# Patient Record
Sex: Male | Born: 1965
Health system: Southern US, Community
[De-identification: ages and names within clinical notes are randomized; demographics above are authoritative.]

## PROBLEM LIST (undated history)

## (undated) DIAGNOSIS — M545 Low back pain, unspecified: Secondary | ICD-10-CM

## (undated) DIAGNOSIS — Z8619 Personal history of other infectious and parasitic diseases: Secondary | ICD-10-CM

## (undated) DIAGNOSIS — G35 Multiple sclerosis: Secondary | ICD-10-CM

## (undated) HISTORY — DX: Multiple sclerosis: G35

## (undated) HISTORY — DX: Low back pain: M54.5

## (undated) HISTORY — DX: Personal history of other infectious and parasitic diseases: Z86.19

## (undated) HISTORY — DX: Low back pain, unspecified: M54.50

---
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
## 1992-06-22 HISTORY — PX: APPENDECTOMY: SHX54

## 2006-07-03 ENCOUNTER — Encounter: Admission: RE | Admit: 2006-07-03 | Discharge: 2006-07-03 | Payer: Self-pay | Admitting: Ophthalmology

## 2006-07-14 ENCOUNTER — Ambulatory Visit (HOSPITAL_COMMUNITY): Admission: RE | Admit: 2006-07-14 | Discharge: 2006-07-14 | Payer: Self-pay | Admitting: Neurology

## 2007-08-19 ENCOUNTER — Ambulatory Visit (HOSPITAL_COMMUNITY): Admission: RE | Admit: 2007-08-19 | Discharge: 2007-08-19 | Payer: Self-pay | Admitting: Neurology

## 2007-11-20 ENCOUNTER — Ambulatory Visit (HOSPITAL_COMMUNITY): Admission: RE | Admit: 2007-11-20 | Discharge: 2007-11-20 | Payer: Self-pay | Admitting: Neurology

## 2008-09-07 ENCOUNTER — Ambulatory Visit (HOSPITAL_COMMUNITY): Admission: RE | Admit: 2008-09-07 | Discharge: 2008-09-07 | Payer: Self-pay | Admitting: Neurology

## 2010-07-13 ENCOUNTER — Encounter: Payer: Self-pay | Admitting: Ophthalmology

## 2010-11-07 NOTE — Op Note (Signed)
NAME:  FAREED, FUNG NO.:  [id number]   MEDICAL RECORD NO.:  [id number]          PATIENT TYPE:  OUT   LOCATION:  [facility]                          FACILITY:  [facility]   PHYSICIAN:  Genene Churn. Love, M.D.    DATE OF BIRTH:  02-08-1966   DATE OF PROCEDURE:  07/14/2006  DATE OF DISCHARGE:                               OPERATIVE REPORT   CLINICAL INDICATIONS:  The patient has a history of abnormal MRI study  of the brain and the cervical spine is being evaluated for the  possibility of a demyelinating disorder.   PROCEDURE:  The patient was prepped and draped in the left lateral  position using Betadine and 1% Xylocaine.  The L4-L5 interspace was  entered without difficulty.  Opening pressure was 150 mmH20.  Clear  colorless CSF was obtained and sent for protein, glucose, cell count,  diff, angiotensin converting enzyme, IgG, oligoclonal IgG. The patient  tolerated the procedure well.           ______________________________  Genene Churn. Sandria Manly, M.D.     JML/MEDQ  D:  07/14/2006  T:  07/14/2006  Job:  [id number]

## 2011-07-27 ENCOUNTER — Ambulatory Visit (INDEPENDENT_AMBULATORY_CARE_PROVIDER_SITE_OTHER): Payer: Self-pay | Admitting: Pharmacist

## 2011-07-27 ENCOUNTER — Encounter: Payer: Self-pay | Admitting: Pharmacist

## 2011-07-27 DIAGNOSIS — G35 Multiple sclerosis: Secondary | ICD-10-CM

## 2011-07-27 DIAGNOSIS — M549 Dorsalgia, unspecified: Secondary | ICD-10-CM | POA: Insufficient documentation

## 2011-07-27 NOTE — Progress Notes (Signed)
  Subjective:    Patient ID: Dennis Leonard, male    DOB: Jun 28, 1965, 46 y.o.   MRN: [id number]  HPI Patient arrives for medication review with significant angst about having to pay $80 for previously $10 Co-Pay Avonex.  He expressed his unhappiness about the need to pay higher medication prices as he has been stable.   We discussed pricing and need to establish medical history under [facility] to have eligibility to lower tier pricing.   Reports seeing Dr. Avie Echevaria as neurologist.  Reports being diagnosed with MS since 2008 and states she is under acceptable level of control.      Review of Systems     Objective:   Physical Exam        Assessment & Plan:  Following medication review, no suggestions for change.  Complete medication list provided to patient.  Total time in face to face medication review: 25 minutes.

## 2011-07-27 NOTE — Assessment & Plan Note (Signed)
MS stable on Avonex.  Following medication review, no suggestions for change.  Complete medication list provided to patient.  Total time in face to face medication review: 25 minutes.

## 2011-07-27 NOTE — Progress Notes (Signed)
  Subjective:    Patient ID: Dennis Leonard, male    DOB: Mar 08, 1966, 46 y.o.   MRN: [id number]  HPI Reviewed and agree with Dr. Macky Lower management.    Review of Systems     Objective:   Physical Exam        Assessment & Plan:

## 2011-08-25 ENCOUNTER — Encounter: Payer: Self-pay | Admitting: Internal Medicine

## 2011-08-25 ENCOUNTER — Ambulatory Visit (INDEPENDENT_AMBULATORY_CARE_PROVIDER_SITE_OTHER): Payer: 59 | Admitting: Internal Medicine

## 2011-08-25 ENCOUNTER — Other Ambulatory Visit (INDEPENDENT_AMBULATORY_CARE_PROVIDER_SITE_OTHER): Payer: 59

## 2011-08-25 VITALS — BP 110/70 | HR 72 | Temp 98.0°F | Ht 72.0 in | Wt 213.4 lb

## 2011-08-25 DIAGNOSIS — Z0000 Encounter for general adult medical examination without abnormal findings: Secondary | ICD-10-CM

## 2011-08-25 LAB — LDL CHOLESTEROL, DIRECT: Direct LDL: 76 mg/dL

## 2011-08-25 LAB — BASIC METABOLIC PANEL
CO2: 29 mEq/L (ref 19–32)
Calcium: 9.5 mg/dL (ref 8.4–10.5)
Chloride: 102 mEq/L (ref 96–112)
Creatinine, Ser: 0.9 mg/dL (ref 0.4–1.5)
Glucose, Bld: 90 mg/dL (ref 70–99)
Potassium: 4.5 mEq/L (ref 3.5–5.1)

## 2011-08-25 LAB — CBC WITH DIFFERENTIAL/PLATELET
Basophils Absolute: 0 10*3/uL (ref 0.0–0.1)
Hemoglobin: 16.3 g/dL (ref 13.0–17.0)
Lymphocytes Relative: 28.1 % (ref 12.0–46.0)
MCV: 94.5 fl (ref 78.0–100.0)
Monocytes Relative: 12.7 % — ABNORMAL HIGH (ref 3.0–12.0)
RBC: 5.04 Mil/uL (ref 4.22–5.81)
RDW: 12.9 % (ref 11.5–14.6)

## 2011-08-25 LAB — HEPATIC FUNCTION PANEL
AST: 27 U/L (ref 0–37)
Albumin: 4.2 g/dL (ref 3.5–5.2)
Total Bilirubin: 0.4 mg/dL (ref 0.3–1.2)

## 2011-08-25 LAB — URINALYSIS
Hgb urine dipstick: NEGATIVE
Ketones, ur: NEGATIVE
Leukocytes, UA: NEGATIVE
Nitrite: NEGATIVE
Urobilinogen, UA: 0.2 (ref 0.0–1.0)

## 2011-08-25 LAB — LIPID PANEL
HDL: 47.4 mg/dL (ref >39.00)
Triglycerides: 316 mg/dL — ABNORMAL HIGH (ref 0.0–149.0)

## 2011-08-25 LAB — TSH: TSH: 1.57 u[IU]/mL (ref 0.35–5.50)

## 2011-08-25 NOTE — Progress Notes (Signed)
  Subjective:    Patient ID: Dennis Leonard, male    DOB: 17-Jan-1966, 46 y.o.   MRN: [id number]  HPI New pt to me and out practice, here today to establish care. Also, patient is here today for annual physical. Patient feels well and has no complaints.  Past Medical History  Diagnosis Date  . Multiple sclerosis, relapsing-remitting 07/23/2006   Family History  Problem Relation Age of Onset  . Breast cancer Mother 37  . Alcohol abuse Maternal Uncle   . Hyperlipidemia Mother   . Lung cancer Maternal Aunt 75   History  Substance Use Topics  . Smoking status: Former Smoker    Types: Cigarettes    Quit date: 06/22/2004  . Smokeless tobacco: Never Used   Comment: [employer], Optometrist. 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 Alcohol Use: No    Review of Systems Constitutional: Negative for fever or weight change.  Respiratory: Negative for cough and shortness of breath.   Cardiovascular: Negative for chest pain or palpitations.  Gastrointestinal: Negative for abdominal pain, no bowel changes.  Musculoskeletal: Negative for gait problem or joint swelling.  Skin: Negative for rash.  Neurological: Negative for dizziness or headache.  No other specific complaints in a complete review of systems (except as listed in HPI above).     Objective:   Physical Exam BP 110/70  Pulse 72  Temp(Src) 98 F (36.7 C) (Oral)  Ht 6' (1.829 m)  Wt 213 lb 6.4 oz (96.798 kg)  BMI 28.94 kg/m2  SpO2 98% Wt Readings from Last 3 Encounters:  08/25/11 213 lb 6.4 oz (96.798 kg)  07/27/11 208 lb 3.2 oz (94.439 kg)   Constitutional:  He is overweight, but appears well-developed and well-nourished. No distress.  HENT: NCAT, no sinus tenderness, TMs clear, OP clear Eyes: PERRL, EOMI - vision grossly intact - no conjunctivitis or icterus Neck: Normal range of motion. Neck supple. No JVD present. No thyromegaly present.  Cardiovascular: Normal rate, regular rhythm and normal heart sounds.  No murmur  heard. no BLE edema Pulmonary/Chest: Effort normal and breath sounds normal. No respiratory distress. no wheezes.  Abdominal: Soft. Bowel sounds are normal. Patient exhibits no distension. There is no tenderness.  Musculoskeletal: No gross deformities. Normal range of motion. Patient exhibits no edema.  Neurological: he is alert and oriented to person, place, and time. No cranial nerve deficit. Coordination normal.  Skin: Skin is warm and dry.  No erythema or ulceration.  Psychiatric: he has a normal mood and affect. behavior is normal. Judgment and thought content normal.   No results found for this basename: WBC, HGB, HCT, PLT, GLUCOSE, CHOL, TRIG, HDL, LDLDIRECT, LDLCALC, ALT, AST, NA, K, CL, CREATININE, BUN, CO2, TSH, PSA, INR, GLUF, HGBA1C, MICROALBUR       Assessment & Plan:  CPX - v70.0 - Patient has been counseled on age-appropriate routine health concerns for screening and prevention. These are reviewed and up-to-date. Immunizations will be reviewed with neuro to verify safety with MS dx (stable, on tx). Labs ordered and will be reviewed.

## 2011-08-25 NOTE — Patient Instructions (Signed)
It was good to see you today. We have reviewed your prior records including labs and tests today we will send to your other provider(s) for "release of records" as discussed today -  Test(s) ordered today. Your results will be called to you after review (48-72hours after test completion). If any changes need to be made, you will be notified at that time. Health Maintenance reviewed - will check on immunization needs, everything else is up to date.  Work on lifestyle changes as discussed (low fat, low carb, increased protein diet; improved exercise efforts; weight loss) to control sugar, blood pressure and cholesterol levels and/or reduce risk of developing other medical problems. Look into [email or web address][email or web address] or other type of food journal to assist you in this process. Please schedule followup in 12 months for annual physical and labs, call sooner if problems.

## 2012-04-26 DIAGNOSIS — G35 Multiple sclerosis: Secondary | ICD-10-CM

## 2012-04-26 DIAGNOSIS — G35D Multiple sclerosis, unspecified: Secondary | ICD-10-CM

## 2012-04-26 DIAGNOSIS — R41840 Attention and concentration deficit: Secondary | ICD-10-CM

## 2012-09-21 ENCOUNTER — Telehealth: Payer: Self-pay

## 2012-09-21 DIAGNOSIS — G35 Multiple sclerosis: Secondary | ICD-10-CM

## 2012-09-21 MED ORDER — INTERFERON BETA-1A 30 MCG/0.5ML IM KIT: 30.0000 ug | PACK | INTRAMUSCULAR | Status: DC

## 2012-09-21 NOTE — Telephone Encounter (Signed)
Patient called clinic requesting new provider and refill on Avonex.  Former Dr Sandria Manly patient.  Sent refill under Dr Eulah Citizen this morning.

## 2012-12-01 ENCOUNTER — Other Ambulatory Visit: Payer: Self-pay | Admitting: Neurology

## 2012-12-07 ENCOUNTER — Telehealth: Payer: Self-pay | Admitting: *Deleted

## 2012-12-07 NOTE — Telephone Encounter (Signed)
Needs an appt with new neurologist

## 2012-12-07 NOTE — Telephone Encounter (Signed)
Message copied by Monico Blitz on Wed Dec 07, 2012  4:22 PM ------      Message from: Seth Bake      Created: Wed Dec 07, 2012 12:52 PM      Contact: patient        States he needs Avonex called to his pharmacy.   He is a [facility] patient and needs to be assigned to a doctor.  He mentioned Dr. Anne Hahn.  He can be reached at [phone] ------

## 2012-12-12 ENCOUNTER — Other Ambulatory Visit: Payer: Self-pay

## 2012-12-12 DIAGNOSIS — G35 Multiple sclerosis: Secondary | ICD-10-CM

## 2012-12-12 MED ORDER — INTERFERON BETA-1A 30 MCG/0.5ML IM KIT: 30.0000 ug | PACK | INTRAMUSCULAR | Status: DC

## 2012-12-12 NOTE — Telephone Encounter (Signed)
Patient has an appt scheduled with Dr Anne Hahn.  He asked that refills for Avonex be sent to [facility] rather than [facility].  Rx has been sent.

## 2013-05-30 ENCOUNTER — Other Ambulatory Visit: Payer: Self-pay | Admitting: Neurology

## 2013-07-03 ENCOUNTER — Ambulatory Visit (INDEPENDENT_AMBULATORY_CARE_PROVIDER_SITE_OTHER): Payer: Commercial Managed Care - PPO | Admitting: Neurology

## 2013-07-03 ENCOUNTER — Encounter (INDEPENDENT_AMBULATORY_CARE_PROVIDER_SITE_OTHER): Payer: Self-pay

## 2013-07-03 ENCOUNTER — Encounter: Payer: Self-pay | Admitting: Neurology

## 2013-07-03 VITALS — BP 138/88 | HR 73 | Wt 217.0 lb

## 2013-07-03 DIAGNOSIS — G35 Multiple sclerosis: Secondary | ICD-10-CM

## 2013-07-03 DIAGNOSIS — Z5181 Encounter for therapeutic drug level monitoring: Secondary | ICD-10-CM

## 2013-07-03 NOTE — Progress Notes (Signed)
Reason for visit: Multiple sclerosis  Dennis Leonard is an 48 y.o. male  History of present illness:  [name] is a 48 year old right-handed white male with a history of multiple sclerosis diagnosed in 2007. The patient presented with double vision, and MRI of the brain showed multiple white matter lesions involving the brain and brainstem, and lesions affecting the spinal cord at the C1 and C5 levels. The patient has undergone MRI evaluation last in 2010. The patient has been on Avonex, and he is tolerating the medication well. The patient does report some fatigue issues, and he was given a prescription for Adderall, but he never took the medication. The patient denies any new symptoms of numbness, weakness, balance problems, or problems with controlling the bowels or the bladder. The patient reports no double vision or visual loss. The patient returns to this office for an evaluation. The patient has not had recent blood work done.  Past Medical History  Diagnosis Date  . Multiple sclerosis, relapsing-remitting 07/23/2006 dx  . Low back pain     Past Surgical History  Procedure Laterality Date  . Tonsillectomy  1974  . Appendectomy  1994    Family History  Problem Relation Age of Onset  . Breast cancer Mother 21  . Hyperlipidemia Mother   . Alcohol abuse Maternal Uncle   . Lung cancer Maternal Aunt 56    Social history:  reports that he quit smoking about 9 years ago. His smoking use included Cigarettes. He smoked 0.00 packs per day. He has never used smokeless tobacco. He reports that he does not drink alcohol or use illicit drugs.   No Known Allergies  Medications:  Current Outpatient Prescriptions on File Prior to Visit  Medication Sig Dispense Refill  . acetaminophen (TYLENOL) 500 MG tablet Take 1-2 tablets (500-1,000 mg total) by mouth every 6 (six) hours as needed for pain.      . AVONEX 30 MCG/0.5ML injection INJECT 0.5ML(30MCG) INTRAMUSCULARLY EVERY 7 DAYS  *TYPICAL ADMINISTRATION DAY IS SATURDAY*  1 each  1  . ibuprofen (ADVIL,MOTRIN) 200 MG tablet Take 2-3 tablets (400-600 mg total) by mouth every 6 (six) hours as needed for pain.      . metaxalone (SKELAXIN) 800 MG tablet Take 1 tablet (800 mg total) by mouth as needed for pain (For Back Pain).       No current facility-administered medications on file prior to visit.    ROS:  Out of a complete 14 system review of symptoms, the patient complains only of the following symptoms, and all other reviewed systems are negative.  Achy muscles, muscle cramps Fatigue  Blood pressure 138/88, pulse 73, weight 217 lb (98.431 kg).  Physical Exam  General: The patient is alert and cooperative at the time of the examination.  Skin: No significant peripheral edema is noted.   Neurologic Exam  Mental status: The patient is oriented x 3.  Cranial nerves: Facial symmetry is present. Speech is normal, no aphasia or dysarthria is noted. Extraocular movements are full. Visual fields are full.  Motor: The patient has good strength in all 4 extremities.  Sensory examination: Soft touch sensation on the face, arms, and legs is symmetric.  Coordination: The patient has good finger-nose-finger and heel-to-shin bilaterally.  Gait and station: The patient has a normal gait. Tandem gait is normal. Romberg is negative. No drift is seen.  Reflexes: Deep tendon reflexes are symmetric, but are somewhat brisk in the lower extremities.   MRI cervical  spine 09/08/2008:  IMPRESSION:  Stable mild cervical spondylosis and stable cervical cord MS. No  new lesions; see comments above.   MRI brain 09/08/2008:  IMPRESSION:  Stable findings of cerebral MS. No new lesions identified.    Assessment/Plan:  1. Multiple sclerosis  The patient will undergo blood work today, and he will be set up for followup MRI evaluations of the brain and cervical spinal cord. If these are stable, the patient will continue  on Avonex. The patient followup in one year.  Marlan Palau. Keith Dani Wallner MD 07/03/2013 8:00 PM  [facility] [address] [place] [id number]  Phone [phone] Fax [phone]

## 2013-07-03 NOTE — Patient Instructions (Signed)
Multiple Sclerosis  Multiple sclerosis (MS) is a disease of the central nervous system. It leads to loss of the insulating covering of the nerves (myelin sheath) of your brain. When this happens, brain signals do not get transmitted properly or may not get transmitted at all. The symptoms of MS occur in episodes or attacks. These attacks may last weeks to months. There may be long periods of nearly no problems between attacks. The age of onset of MS varies.   CAUSES  The cause of MS is unknown. However, it is more common in the northern [place] than in the southern [place].  RISK FACTORS  There is a higher incidence of MS in women than in men. MS is not an inherited illness, although your risk of MS is higher if you have a relative with MS.  SIGNS AND SYMPTOMS   The symptoms of MS occur in episodes or attacks. These attacks may last weeks to months. There may be long periods of almost no symptoms between attacks.  The symptoms of MS vary. This is because of the many different ways it affects the central nervous system. The main symptoms of MS include:   Vision problems and eye pain.   Numbness.   Weakness.   Paralysis in your arms, hands, feet, and legs (extremities).   Balance problems.   Tremors.  DIAGNOSIS   Your health care provider can diagnose MS with the help of imaging exams and lab tests. These may include specialized X-ray exams and spinal fluid tests. The best imaging exam to confirm a diagnosis of MS is MRI.  TREATMENT   There is no known cure for MS, but there are medicines that can decrease the number and frequency of attacks. Steroids are often used for short-term relief. Physical and occupational therapy may also help.  HOME CARE INSTRUCTIONS    Take medicines as directed by your health care provider.   Exercise as directed by your health care provider.  SEEK MEDICAL CARE IF:  You begin to feel depressed.  SEEK IMMEDIATE MEDICAL CARE IF:   You develop paralysis.   You develop  problems with bladder, bowel, or sexual function.   You develop mental changes, such as forgetfulness or mood swings.   You have a seizure.  Document Released: 06/05/2000 Document Revised: 03/29/2013 Document Reviewed: 02/13/2013  Exit[employer] Patient Information 2014 [employer].

## 2013-07-04 ENCOUNTER — Telehealth: Payer: Self-pay | Admitting: Neurology

## 2013-07-04 DIAGNOSIS — M549 Dorsalgia, unspecified: Secondary | ICD-10-CM

## 2013-07-04 LAB — COMPREHENSIVE METABOLIC PANEL
A/G RATIO: 1.6 (ref 1.1–2.5)
ALBUMIN: 4.4 g/dL (ref 3.5–5.5)
ALK PHOS: 52 IU/L (ref 39–117)
ALT: 34 IU/L (ref 0–44)
AST: 18 IU/L (ref 0–40)
BUN / CREAT RATIO: 14 (ref 9–20)
BUN: 14 mg/dL (ref 6–24)
CALCIUM: 9.6 mg/dL (ref 8.7–10.2)
CO2: 28 mmol/L (ref 18–29)
CREATININE: 1 mg/dL (ref 0.76–1.27)
Chloride: 104 mmol/L (ref 96–108)
GFR calc Af Amer: 105 mL/min/{1.73_m2} (ref >59)
GFR, EST NON AFRICAN AMERICAN: 90 mL/min/{1.73_m2} (ref >59)
Globulin, Total: 2.8 g/dL (ref 1.5–4.5)
Glucose: 86 mg/dL (ref 65–99)
POTASSIUM: 4.5 mmol/L (ref 3.5–5.2)
SODIUM: 140 mmol/L (ref 134–144)
TOTAL PROTEIN: 7.2 g/dL (ref 6.0–8.5)
Total Bilirubin: 0.5 mg/dL (ref 0.0–1.2)

## 2013-07-04 LAB — CBC WITH DIFFERENTIAL
BASOS ABS: 0 10*3/uL (ref 0.0–0.2)
BASOS: 0 %
EOS: 1 %
Eosinophils Absolute: 0.1 10*3/uL (ref 0.0–0.4)
HEMATOCRIT: 46.1 % (ref 37.5–51.0)
Hemoglobin: 16.4 g/dL (ref 12.6–17.7)
LYMPHS: 28 %
Lymphocytes Absolute: 1.5 10*3/uL (ref 0.7–3.1)
MCH: 32.3 pg (ref 26.6–33.0)
MCHC: 35.6 g/dL (ref 31.5–35.7)
MCV: 91 fL (ref 79–97)
MONOCYTES: 9 %
Monocytes Absolute: 0.5 10*3/uL (ref 0.1–0.9)
NEUTROS PCT: 62 %
Neutrophils Absolute: 3.4 10*3/uL (ref 1.4–7.0)
Platelets: 330 10*3/uL (ref 150–379)
RBC: 5.08 x10E6/uL (ref 4.14–5.80)
RDW: 13.9 % (ref 12.3–15.4)
WBC: 5.5 10*3/uL (ref 3.4–10.8)

## 2013-07-04 MED ORDER — METAXALONE 800 MG PO TABS: 400.0000 mg | ORAL_TABLET | Freq: Three times a day (TID) | ORAL | Status: DC | PRN

## 2013-07-04 NOTE — Telephone Encounter (Signed)
By viewing the chart, the last time Skelaxin was prescribed by out practice was in 2011 (Dr Sandria ManlyLove). Would you like to fill this med?  Please advise.  Thank you.

## 2013-07-04 NOTE — Telephone Encounter (Signed)
I will refill the prescription for the Skelaxin.

## 2013-07-04 NOTE — Telephone Encounter (Signed)
Patient needs prescription refilll for Skelaxin. Patient also wants to make sure MRI is scheduled through [facility].

## 2013-07-14 ENCOUNTER — Encounter: Payer: Self-pay | Admitting: Neurology

## 2013-07-21 ENCOUNTER — Other Ambulatory Visit: Payer: Self-pay | Admitting: Neurology

## 2014-05-08 ENCOUNTER — Telehealth: Payer: Self-pay | Admitting: Neurology

## 2014-05-08 NOTE — Telephone Encounter (Signed)
Left message for patient regarding rescheduling 07/03/14 appointment per Larita Fife leaving, rescheduled to Dr. Anne Hahn' first available on 09/18/14.

## 2014-06-27 ENCOUNTER — Telehealth: Payer: Self-pay | Admitting: Neurology

## 2014-06-27 DIAGNOSIS — G35 Multiple sclerosis: Secondary | ICD-10-CM

## 2014-06-27 NOTE — Telephone Encounter (Signed)
The patient never had his MRI Brain and cervical tests done that were ordered January 2015.  He will need new orders placed so the tests can be authorized and completed before his appointment scheduled 09-18-14.

## 2014-06-27 NOTE — Telephone Encounter (Signed)
I will reorder the MRI of the brain and cervical spine.

## 2014-06-27 NOTE — Telephone Encounter (Signed)
Left message for patient to return call and relay if MRI has been done in the last year.  According to patient's chart it looks like the order was place last year but MRI's were not done.

## 2014-06-27 NOTE — Telephone Encounter (Signed)
Patient questioning if MRI is needed prior to appointment on 3/29.  Please call work # [phone] and advise.

## 2014-06-27 NOTE — Telephone Encounter (Signed)
Pt called back and stated he did not have the MRI done and he needs a new order for it.  Please advise.

## 2014-07-03 ENCOUNTER — Ambulatory Visit: Payer: Commercial Managed Care - PPO | Admitting: Nurse Practitioner

## 2014-08-08 ENCOUNTER — Telehealth: Payer: Self-pay | Admitting: Neurology

## 2014-08-08 MED ORDER — INTERFERON BETA-1A 30 MCG/0.5ML IM PSKT: 30.0000 ug | PREFILLED_SYRINGE | INTRAMUSCULAR | Status: DC

## 2014-08-08 NOTE — Telephone Encounter (Signed)
Rx has been sent.  I called back, got no answer.  Left message. 

## 2014-08-08 NOTE — Telephone Encounter (Signed)
Pt is calling requesting a refill on AVONEX 30 MCG/0.5ML injection needs to be sent to [facility]. Please call and advise.

## 2014-09-18 ENCOUNTER — Encounter: Payer: Self-pay | Admitting: Neurology

## 2014-09-18 ENCOUNTER — Ambulatory Visit (INDEPENDENT_AMBULATORY_CARE_PROVIDER_SITE_OTHER): Payer: 59 | Admitting: Neurology

## 2014-09-18 VITALS — BP 139/88 | HR 94 | Ht 72.0 in | Wt 217.0 lb

## 2014-09-18 DIAGNOSIS — Z5181 Encounter for therapeutic drug level monitoring: Secondary | ICD-10-CM

## 2014-09-18 DIAGNOSIS — G35 Multiple sclerosis: Secondary | ICD-10-CM

## 2014-09-18 NOTE — Patient Instructions (Signed)

## 2014-09-18 NOTE — Progress Notes (Signed)
Reason for visit: Multiple sclerosis  Dennis Leonard is an 49 y.o. male  History of present illness:  Dennis Leonard is a 49 year old right-handed white male with a history of multiple sclerosis. The patient has been on Avonex for number of years, and he has tolerated the medication well. He has not had any clinical MS attacks in at last 5 years. The last MRI of the brain and cervical spinal cord was done in 2010. The studies were reordered on his last visit, the patient has not gotten the evaluations done. The patient does have a history of low back pain as well, but he indicates that he has started to work out on a regular basis, and his back discomfort has improved over time. He has Skelaxin at home to take if he needs it. The patient again reports no new numbness, weakness, balance changes, bowel or bladder control issues, or visual changes. He returns for an evaluation. He occasionally will note some clumsiness with his left leg with ambulation.  Past Medical History  Diagnosis Date  . Multiple sclerosis, relapsing-remitting 07/23/2006 dx  . Low back pain     Past Surgical History  Procedure Laterality Date  . Tonsillectomy  1974  . Appendectomy  1994    Family History  Problem Relation Age of Onset  . Breast cancer Mother 65  . Hyperlipidemia Mother   . Alcohol abuse Maternal Uncle   . Lung cancer Maternal Aunt 2    Social history:  reports that he quit smoking about 10 years ago. His smoking use included Cigarettes. He has never used smokeless tobacco. He reports that he does not drink alcohol or use illicit drugs.   No Known Allergies  Medications:  Prior to Admission medications   Medication Sig Start Date End Date Taking? Authorizing Provider  acetaminophen (TYLENOL) 500 MG tablet Take 1-2 tablets (500-1,000 mg total) by mouth every 6 (six) hours as needed for pain. 07/27/11  Yes Moses Manners, MD  ibuprofen (ADVIL,MOTRIN) 200 MG tablet Take 2-3 tablets (400-600 mg  total) by mouth every 6 (six) hours as needed for pain. 07/27/11  Yes Moses Manners, MD  interferon beta-1a (AVONEX) 30 MCG/0.5ML PSKT injection Inject 0.5 mLs (30 mcg total) into the muscle every 7 (seven) days. 08/08/14  Yes York Spaniel, MD  metaxalone (SKELAXIN) 800 MG tablet Take 0.5 tablets (400 mg total) by mouth 3 (three) times daily as needed. 07/04/13  Yes York Spaniel, MD    ROS:  Out of a complete 14 system review of symptoms, the patient complains only of the following symptoms, and all other reviewed systems are negative.  Walking difficulty  Blood pressure 139/88, pulse 94, height 6' (1.829 m), weight 217 lb (98.431 kg).  Physical Exam  General: The patient is alert and cooperative at the time of the examination.  Skin: No significant peripheral edema is noted.   Neurologic Exam  Mental status: The patient is alert and oriented x 3 at the time of the examination. The patient has apparent normal recent and remote memory, with an apparently normal attention span and concentration ability.   Cranial nerves: Facial symmetry is present. Speech is normal, no aphasia or dysarthria is noted. Extraocular movements are full. Visual fields are full. Pupils are equal, round, and reactive to light. Discs are flat bilaterally.  Motor: The patient has good strength in all 4 extremities.  Sensory examination: Soft touch sensation is symmetric on the face, arms, and legs.  Coordination: The patient has good finger-nose-finger and heel-to-shin bilaterally.  Gait and station: The patient has a normal gait. Tandem gait is normal. Romberg is negative. No drift is seen.  Reflexes: Deep tendon reflexes are symmetric.   Assessment/Plan:  1. Multiple sclerosis  2. History of low back pain  The patient appears to be doing fairly well. He will continue Avonex for now, blood work will be done today. When the patient does get the time to have MRI evaluation of the brain and  cervical cord, he is to contact our office, and we will reorder the studies. He will otherwise follow-up in one year, sooner if needed.  Marlan Palau MD 09/18/2014 8:16 PM  [facility] [address] [place], [place] [address]  Phone [phone] Fax [phone]

## 2014-09-19 LAB — CBC WITH DIFFERENTIAL/PLATELET
BASOS: 0 %
Basophils Absolute: 0 10*3/uL (ref 0.0–0.2)
EOS ABS: 0.1 10*3/uL (ref 0.0–0.4)
EOS: 1 %
HEMATOCRIT: 47.3 % (ref 37.5–51.0)
HEMOGLOBIN: 16.4 g/dL (ref 12.6–17.7)
IMMATURE GRANS (ABS): 0 10*3/uL (ref 0.0–0.1)
IMMATURE GRANULOCYTES: 0 %
LYMPHS ABS: 2 10*3/uL (ref 0.7–3.1)
Lymphs: 37 %
MCH: 32.5 pg (ref 26.6–33.0)
MCHC: 34.7 g/dL (ref 31.5–35.7)
MCV: 94 fL (ref 79–97)
MONOCYTES: 13 %
Monocytes Absolute: 0.7 10*3/uL (ref 0.1–0.9)
NEUTROS ABS: 2.5 10*3/uL (ref 1.4–7.0)
NEUTROS PCT: 49 %
PLATELETS: 309 10*3/uL (ref 150–379)
RBC: 5.04 x10E6/uL (ref 4.14–5.80)
RDW: 13.8 % (ref 12.3–15.4)
WBC: 5.3 10*3/uL (ref 3.4–10.8)

## 2014-09-19 LAB — COMPREHENSIVE METABOLIC PANEL
ALBUMIN: 4.3 g/dL (ref 3.5–5.5)
ALK PHOS: 51 IU/L (ref 39–117)
ALT: 43 IU/L (ref 0–44)
AST: 29 IU/L (ref 0–40)
Albumin/Globulin Ratio: 1.5 (ref 1.1–2.5)
BILIRUBIN TOTAL: 0.5 mg/dL (ref 0.0–1.2)
BUN/Creatinine Ratio: 10 (ref 9–20)
BUN: 11 mg/dL (ref 6–24)
CHLORIDE: 100 mmol/L (ref 97–108)
CO2: 25 mmol/L (ref 18–29)
Calcium: 9.8 mg/dL (ref 8.7–10.2)
Creatinine, Ser: 1.06 mg/dL (ref 0.76–1.27)
GFR, EST AFRICAN AMERICAN: 97 mL/min/{1.73_m2} (ref >59)
GFR, EST NON AFRICAN AMERICAN: 84 mL/min/{1.73_m2} (ref >59)
GLOBULIN, TOTAL: 2.9 g/dL (ref 1.5–4.5)
GLUCOSE: 91 mg/dL (ref 65–99)
POTASSIUM: 4.4 mmol/L (ref 3.5–5.2)
Sodium: 141 mmol/L (ref 134–144)
TOTAL PROTEIN: 7.2 g/dL (ref 6.0–8.5)

## 2014-10-01 ENCOUNTER — Other Ambulatory Visit: Payer: Self-pay | Admitting: Neurology

## 2015-07-15 MED FILL — AVONEX PREFILLED SYR 30 MCG: 30 | 28 days supply | Qty: 1 | Fill #9

## 2015-08-10 MED FILL — AVONEX PREFILLED SYR 30 MCG: 30 | 28 days supply | Qty: 1 | Fill #10

## 2015-09-09 ENCOUNTER — Encounter: Payer: Self-pay | Admitting: Nurse Practitioner

## 2015-09-09 ENCOUNTER — Ambulatory Visit (INDEPENDENT_AMBULATORY_CARE_PROVIDER_SITE_OTHER): Payer: 59 | Admitting: Nurse Practitioner

## 2015-09-09 VITALS — BP 128/85 | HR 74 | Ht 72.0 in | Wt 218.6 lb

## 2015-09-09 DIAGNOSIS — G35 Multiple sclerosis: Secondary | ICD-10-CM

## 2015-09-09 DIAGNOSIS — M545 Low back pain: Secondary | ICD-10-CM | POA: Diagnosis not present

## 2015-09-09 MED ORDER — INTERFERON BETA-1A 30 MCG/0.5ML IM PSKT: PREFILLED_SYRINGE | INTRAMUSCULAR | Status: DC

## 2015-09-09 MED ORDER — METAXALONE 800 MG PO TABS: 400.0000 mg | ORAL_TABLET | Freq: Three times a day (TID) | ORAL | Status: DC | PRN

## 2015-09-09 MED FILL — AVONEX PREFILLED SYR 30 MCG: 30 | 28 days supply | Qty: 1 | Fill #11

## 2015-09-09 MED FILL — METAXALONE 800 MG TABLET: 800 | 30 days supply | Qty: 45 | Fill #0

## 2015-09-09 NOTE — Progress Notes (Signed)
[facility]  PATIENT: JAMARQUIS CRULL DOB: 1966-04-07   REASON FOR VISIT: For multiple sclerosis, low back pain HISTORY FROM: Patient    HISTORY OF PRESENT ILLNESS:Mr. Yom is a 50 year old right-handed white male with a history of multiple sclerosis. The patient has been on Avonex for number of years, and he has tolerated the medication well. He has not had any clinical MS attacks in at last 6 years. The last MRI of the brain and cervical spinal cord was done in 2010.  The patient does have a history of low back pain as well, but he indicates that he has started to work out on a regular basis, and his back discomfort has improved over time. He has Skelaxin at home to take if he needs it. The patient again reports no new numbness, weakness, balance changes, bowel or bladder control issues, or visual changes. He returns for an evaluation. He uses his treadmill every day and he occasionally will note some clumsiness with his left leg with ambulation. He returns for reevaluation    REVIEW OF SYSTEMS: Full 14 system review of systems performed and notable only for those listed, all others are neg:  Constitutional: neg  Cardiovascular: neg Ear/Nose/Throat: neg  Skin: neg Eyes: neg Respiratory: neg Gastroitestinal: neg  Hematology/Lymphatic: neg  Endocrine: neg Musculoskeletal:neg Allergy/Immunology: neg Neurological: neg Psychiatric: neg Sleep : neg   ALLERGIES: No Known Allergies  HOME MEDICATIONS: Outpatient Prescriptions Prior to Visit  Medication Sig Dispense Refill  . acetaminophen (TYLENOL) 500 MG tablet Take 1-2 tablets (500-1,000 mg total) by mouth every 6 (six) hours as needed for pain.    . AVONEX PREFILLED 30 MCG/0.5ML PSKT injection INJECT 0.5 MLS INTO THE MUSCLE EVERY 7 DAYS. 1 each 11  . ibuprofen (ADVIL,MOTRIN) 200 MG tablet Take 2-3 tablets (400-600 mg total) by mouth every 6 (six) hours as needed for pain.    . metaxalone (SKELAXIN) 800  MG tablet Take 0.5 tablets (400 mg total) by mouth 3 (three) times daily as needed. 50 tablet 3   No facility-administered medications prior to visit.    PAST MEDICAL HISTORY: Past Medical History  Diagnosis Date  . Multiple sclerosis, relapsing-remitting (HCC) 07/23/2006 dx  . Low back pain     PAST SURGICAL HISTORY: Past Surgical History  Procedure Laterality Date  . Tonsillectomy  1974  . Appendectomy  1994    FAMILY HISTORY: Family History  Problem Relation Age of Onset  . Breast cancer Mother 70  . Hyperlipidemia Mother   . Alcohol abuse Maternal Uncle   . Lung cancer Maternal Aunt 66    SOCIAL HISTORY: Social History   Social History  . Marital Status: Married    Spouse Name: N/A  . Number of Children: 2  . Years of Education: COLLEGE   Occupational History  . JUDGE    Social History Main Topics  . Smoking status: Former Smoker    Types: Cigarettes    Quit date: 06/22/2004  . Smokeless tobacco: Never Used     Comment: JD, Optometrist. 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 Alcohol Use: No  . Drug Use: No  . Sexual Activity: Not on file   Other Topics Concern  . Not on file   Social History Narrative   Patient drinks 1-2 cups of caffeine daily.   Patient is right handed.     PHYSICAL EXAM  Filed Vitals:   09/09/15 0948  BP: 128/85  Pulse: 74  Height:  6' (1.829 m)  Weight: 218 lb 9.6 oz (99.156 kg)   Body mass index is 29.64 kg/(m^2).  Generalized: Well developed, in no acute distress  Head: normocephalic and atraumatic,. Oropharynx benign  Neck: Supple, no carotid bruits  Cardiac: Regular rate rhythm, no murmur  Musculoskeletal: No deformity   Neurological examination   Mentation: Alert oriented to time, place, history taking. Attention span and concentration appropriate. Recent and remote memory intact.  Follows all commands speech and language fluent.   Cranial nerve II-XII: Pupils were equal round reactive to light  extraocular movements were full, visual field were full on confrontational test. Facial sensation and strength were normal. hearing was intact to finger rubbing bilaterally. Uvula tongue midline. Head turning and shoulder shrug were normal and symmetric.Tongue protrusion into cheek strength was normal. Motor: normal bulk and tone, full strength in the BUE, BLE, fine finger movements normal, no pronator drift. No focal weakness Sensory: normal and symmetric to light touch, pinprick, and  Vibration, proprioception  Coordination: finger-nose-finger, heel-to-shin bilaterally, no dysmetria Reflexes: Brachioradialis 2/2, biceps 2/2, triceps 2/2, patellar 2/2, Achilles 2/2, plantar responses were flexor bilaterally. Gait and Station: Rising up from seated position without assistance, normal stance,  moderate stride, good arm swing, smooth turning, able to perform tiptoe, and heel walking without difficulty. Tandem gait is steady  DIAGNOSTIC DATA (LABS, IMAGING, TESTING) - ASSESSMENT AND PLAN  50 y.o. year old male  has a past medical history of Multiple sclerosis, relapsing-remitting (HCC) (07/23/2006 dx) and Low back pain. here to follow-up. No exacerbation of MS symptoms in  6 years.  Continue Avonex weekly will renew Labs today, CBC and CMP Will renew Skelaxin  Needs to have repeat MRI of the brain and cervical spine, this was ordered last year Follow-up yearly Nilda Riggs, [facility], [facility], APRN  [facility] [address] [place], [place] [address] [phone]

## 2015-09-09 NOTE — Patient Instructions (Signed)
Continue Avonex weekly Labs today Will renew Skelaxin Follow-up yearly

## 2015-09-09 NOTE — Progress Notes (Signed)
I have read the note, and I agree with the clinical assessment and plan.  Rakeya Glab KEITH   

## 2015-09-10 ENCOUNTER — Telehealth: Payer: Self-pay | Admitting: Nurse Practitioner

## 2015-09-10 LAB — CBC WITH DIFFERENTIAL/PLATELET
BASOS ABS: 0 10*3/uL (ref 0.0–0.2)
Basos: 0 %
EOS (ABSOLUTE): 0.1 10*3/uL (ref 0.0–0.4)
EOS: 1 %
Hematocrit: 44.5 % (ref 37.5–51.0)
Hemoglobin: 15.9 g/dL (ref 12.6–17.7)
IMMATURE GRANULOCYTES: 1 %
Immature Grans (Abs): 0 10*3/uL (ref 0.0–0.1)
LYMPHS ABS: 1.5 10*3/uL (ref 0.7–3.1)
Lymphs: 29 %
MCH: 32.7 pg (ref 26.6–33.0)
MCHC: 35.7 g/dL (ref 31.5–35.7)
MCV: 92 fL (ref 79–97)
MONOS ABS: 0.7 10*3/uL (ref 0.1–0.9)
Monocytes: 12 %
NEUTROS PCT: 57 %
Neutrophils Absolute: 3.1 10*3/uL (ref 1.4–7.0)
PLATELETS: 308 10*3/uL (ref 150–379)
RBC: 4.86 x10E6/uL (ref 4.14–5.80)
RDW: 13.7 % (ref 12.3–15.4)
WBC: 5.3 10*3/uL (ref 3.4–10.8)

## 2015-09-10 LAB — COMPREHENSIVE METABOLIC PANEL
ALK PHOS: 53 IU/L (ref 39–117)
ALT: 33 IU/L (ref 0–44)
AST: 26 IU/L (ref 0–40)
Albumin/Globulin Ratio: 1.6 (ref 1.2–2.2)
Albumin: 4.5 g/dL (ref 3.5–5.5)
BILIRUBIN TOTAL: 0.4 mg/dL (ref 0.0–1.2)
BUN/Creatinine Ratio: 15 (ref 9–20)
BUN: 14 mg/dL (ref 6–24)
CHLORIDE: 98 mmol/L (ref 96–106)
CO2: 23 mmol/L (ref 18–29)
CREATININE: 0.95 mg/dL (ref 0.76–1.27)
Calcium: 9.7 mg/dL (ref 8.7–10.2)
GFR calc Af Amer: 110 mL/min/{1.73_m2} (ref >59)
GFR calc non Af Amer: 95 mL/min/{1.73_m2} (ref >59)
GLOBULIN, TOTAL: 2.8 g/dL (ref 1.5–4.5)
GLUCOSE: 78 mg/dL (ref 65–99)
POTASSIUM: 4.9 mmol/L (ref 3.5–5.2)
SODIUM: 140 mmol/L (ref 134–144)
Total Protein: 7.3 g/dL (ref 6.0–8.5)

## 2015-09-10 NOTE — Telephone Encounter (Signed)
Called and spoke to patient relayed normal labs. Patient understood details.

## 2015-09-10 NOTE — Telephone Encounter (Signed)
-----   Message from Nilda Riggs, NP sent at 09/10/2015  7:54 AM EDT ----- Labs normal please call the patient

## 2015-09-17 ENCOUNTER — Ambulatory Visit: Payer: 59 | Admitting: Nurse Practitioner

## 2015-10-28 MED FILL — AVONEX PREFILLED SYR 30 MCG: 30 | 28 days supply | Qty: 1 | Fill #0

## 2015-11-30 MED FILL — AVONEX PREFILLED SYR 30 MCG: 30 | 28 days supply | Qty: 1 | Fill #1

## 2015-12-09 ENCOUNTER — Ambulatory Visit (HOSPITAL_BASED_OUTPATIENT_CLINIC_OR_DEPARTMENT_OTHER): Payer: 59 | Admitting: Pharmacist

## 2015-12-09 DIAGNOSIS — G35 Multiple sclerosis: Secondary | ICD-10-CM

## 2015-12-09 MED ORDER — INTERFERON BETA-1A 30 MCG/0.5ML IM PSKT: PREFILLED_SYRINGE | INTRAMUSCULAR | Status: DC

## 2015-12-09 NOTE — Progress Notes (Signed)
S: Patient presents today to the [facility].  Patient is currently taking Avonex for multiple sclerosis. Patient is managed by Dr. Anne Hahn for this.   Adherence: denies any missed doses - his wife is a Publishing rights manager and she administers the injections for him.   Current adverse effects:  Labs: WNL (including LFTs) Flu-like symptoms: reported that they were severe when he started the medication but that they have improved over the years. He will feel better the day after the injection. Injection site reactions: pain upon injection but it resolves as soon as the injection is over. Neuropsychiatric side effects: denies   O:     Lab Results  Component Value Date   WBC 5.3 09/09/2015   HGB 16.4 09/18/2014   HCT 44.5 09/09/2015   MCV 92 09/09/2015   PLT 308 09/09/2015      Chemistry      Component Value Date/Time   NA 140 09/09/2015 1106   NA 135 08/25/2011 1038   K 4.9 09/09/2015 1106   CL 98 09/09/2015 1106   CO2 23 09/09/2015 1106   BUN 14 09/09/2015 1106   BUN 12 08/25/2011 1038   CREATININE 0.95 09/09/2015 1106      Component Value Date/Time   CALCIUM 9.7 09/09/2015 1106   ALKPHOS 53 09/09/2015 1106   AST 26 09/09/2015 1106   ALT 33 09/09/2015 1106   BILITOT 0.4 09/09/2015 1106   BILITOT 0.5 07/03/2013 0931       A/P: 1. Medication review: patient currently tolerating and stable on Avonex for MS with only pain upon injection and now mild flu-like symptoms as an adverse effect. He denies any recent relapse and believes that the Avonex is working well. He is interested in switching to Gilenya (PO) so provided some information on the medication and encouraged him to discuss with his neurologist at next visit. Reviewed Avonex, including injection technique and adverse effects. No recommendations for any changes.   Juanita Craver, PharmD, BCPS, CPP Clinical Pharmacist Practitioner  [facility] [phone]

## 2016-01-07 MED FILL — AVONEX PREFILLED SYR 30 MCG: 30 | 28 days supply | Qty: 1 | Fill #0

## 2016-02-03 MED FILL — AVONEX PREFILLED SYR 30 MCG: 30 | 28 days supply | Qty: 1 | Fill #1

## 2016-03-08 MED FILL — AVONEX PREFILLED SYR 30 MCG: 30 | 28 days supply | Qty: 1 | Fill #2

## 2016-03-19 ENCOUNTER — Telehealth: Payer: Self-pay | Admitting: Neurology

## 2016-03-19 DIAGNOSIS — G35 Multiple sclerosis: Secondary | ICD-10-CM

## 2016-03-19 NOTE — Telephone Encounter (Signed)
I called patient. MRI the brain and cervical spine ordered in January 2015, the patient did not do the study at that time for financial reasons. He is now considering getting the scan done, the last study was in 2010. He is trying to find out how to get the study done for the lowest cost. He will call us if he wishes us to order the study.

## 2016-03-19 NOTE — Telephone Encounter (Signed)
Repeat MRIs were ordered by Dr. Anne Hahn when he saw him in 2015. They were never done

## 2016-03-19 NOTE — Telephone Encounter (Signed)
Patient called and left me a message stating that he was supposed to be having an MRI. There is no order in the system for an MRI, how should I proceed?

## 2016-04-05 MED FILL — AVONEX PREFILLED SYR 30 MCG: 30 | 28 days supply | Qty: 1 | Fill #3

## 2016-04-23 NOTE — Addendum Note (Signed)
Addended by: Stephanie AcreWILLIS, Tanvir Hipple on: 04/23/2016 06:20 PM   Modules accepted: Orders

## 2016-04-23 NOTE — Telephone Encounter (Signed)
Wife called, states Dr. Anne Hahn told them, when they decided which location to have MRI, Dr. Anne Hahn would put the order in. Wife states husband has [facility] and would like to go to a facility within [facility].

## 2016-04-23 NOTE — Telephone Encounter (Signed)
I will put the order in for the MRI of the brain and cervical spine.

## 2016-04-24 DIAGNOSIS — H5213 Myopia, bilateral: Secondary | ICD-10-CM | POA: Diagnosis not present

## 2016-05-10 MED FILL — AVONEX PREFILLED SYR 30 MCG: 30 | 28 days supply | Qty: 1 | Fill #4

## 2016-06-04 DIAGNOSIS — L219 Seborrheic dermatitis, unspecified: Secondary | ICD-10-CM | POA: Diagnosis not present

## 2016-06-04 DIAGNOSIS — L570 Actinic keratosis: Secondary | ICD-10-CM | POA: Diagnosis not present

## 2016-06-04 MED FILL — KETOCONAZOLE 2% CREAM: 2 | 30 days supply | Qty: 60 | Fill #0

## 2016-06-04 MED FILL — TRIAMCINOLONE 0.025% CREAM: 0.025 | 30 days supply | Qty: 60 | Fill #0

## 2016-06-08 MED FILL — AVONEX PREFILLED SYR 30 MCG: 30 | 28 days supply | Qty: 1 | Fill #5

## 2016-06-16 ENCOUNTER — Encounter: Payer: Self-pay | Admitting: Emergency Medicine

## 2016-06-16 ENCOUNTER — Emergency Department (INDEPENDENT_AMBULATORY_CARE_PROVIDER_SITE_OTHER)
Admission: EM | Admit: 2016-06-16 | Discharge: 2016-06-16 | Disposition: A | Discharge status: Home / Self Care | Payer: 59 | Source: Home / Self Care | Attending: Family Medicine | Admitting: Family Medicine

## 2016-06-16 DIAGNOSIS — L0201 Cutaneous abscess of face: Secondary | ICD-10-CM | POA: Diagnosis not present

## 2016-06-16 MED ORDER — DOXYCYCLINE HYCLATE 100 MG PO CAPS: 100.0000 mg | ORAL_CAPSULE | Freq: Two times a day (BID) | ORAL | 0 refills | Status: DC

## 2016-06-16 MED ORDER — IBUPROFEN 600 MG PO TABS: 600.0000 mg | ORAL_TABLET | Freq: Once | ORAL | Status: DC

## 2016-06-16 MED FILL — DOXYCYCLINE HYCLATE 100 MG: 100 | 7 days supply | Qty: 14 | Fill #0

## 2016-06-16 NOTE — ED Triage Notes (Signed)
Abscess on right lower jaw x 1.5 days, red swollen painful

## 2016-06-16 NOTE — ED Provider Notes (Signed)
CSN: [id number]     Arrival date & time 06/16/16  0848 History   First MD Initiated Contact with Patient 06/16/16 [phone]     Chief Complaint  Patient presents with  . Abscess   (Consider location/radiation/quality/duration/timing/severity/associated sxs/prior Treatment) HPI  Dennis Leonard is a 50 y.o. male presenting to UC with c/o sudden onset, suddenly worsening Right side face abscess.  Mildly itchy.  Area is red and hard, scant drainage, tender to touch.  Pain is 8/10.  His wife is a Publishing rights manager and was concerned about how fast the area worsened. Denies fever, chills, n/v/d. No hx of similar abscess.    Past Medical History:  Diagnosis Date  . Low back pain   . Multiple sclerosis, relapsing-remitting (HCC) 07/23/2006 dx   Past Surgical History:  Procedure Laterality Date  . APPENDECTOMY  1994  . TONSILLECTOMY  1974   Family History  Problem Relation Age of Onset  . Breast cancer Mother 29  . Hyperlipidemia Mother   . Alcohol abuse Maternal Uncle   . Lung cancer Maternal Aunt 64   Social History  Substance Use Topics  . Smoking status: Former Smoker    Types: Cigarettes    Quit date: 06/22/2004  . Smokeless tobacco: Never Used     Comment: JD, Optometrist. 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 Alcohol use No    Review of Systems  Constitutional: Negative for chills and fever.  Musculoskeletal: Negative for arthralgias and myalgias.  Skin: Positive for color change and wound. Negative for rash.    Allergies  Patient has no known allergies.  Home Medications   Prior to Admission medications   Medication Sig Start Date End Date Taking? Authorizing Provider  acetaminophen (TYLENOL) 500 MG tablet Take 1-2 tablets (500-1,000 mg total) by mouth every 6 (six) hours as needed for pain. 07/27/11   Moses Manners, MD  doxycycline (VIBRAMYCIN) 100 MG capsule Take 1 capsule (100 mg total) by mouth 2 (two) times daily. One po bid x 7 days 06/16/16   Junius Finner, PA-C  ibuprofen (ADVIL,MOTRIN) 200 MG tablet Take 2-3 tablets (400-600 mg total) by mouth every 6 (six) hours as needed for pain. 07/27/11   Moses Manners, MD  interferon beta-1a (AVONEX PREFILLED) 30 MCG/0.5ML PSKT injection INJECT 0.5 MLS INTO THE MUSCLE EVERY 7 DAYS. 12/09/15   Quentin Angst, MD  metaxalone (SKELAXIN) 800 MG tablet Take 0.5 tablets (400 mg total) by mouth 3 (three) times daily as needed. 09/09/15   Nilda Riggs, NP   Meds Ordered and Administered this Visit   Medications  ibuprofen (ADVIL,MOTRIN) tablet 600 mg (not administered)    BP 137/85 (BP Location: Left Arm)   Pulse 86   Temp 98.5 F (36.9 C) (Oral)   Ht 6' (1.829 m)   Wt 223 lb (101.2 kg)   SpO2 97%   BMI 30.24 kg/m  No data found.   Physical Exam  Constitutional: He is oriented to person, place, and time. He appears well-developed and well-nourished. No distress.  HENT:  Head: Normocephalic. Head is with abrasion.    Right side face: 0.5cm area of erythema, induration, and pinpoint centralized scab. No active bleeding or drainage. Mildly tender.   Eyes: EOM are normal.  Neck: Normal range of motion.  Cardiovascular: Normal rate.   Pulmonary/Chest: Effort normal.  Musculoskeletal: Normal range of motion.  Neurological: He is alert and oriented to person, place, and time.  Skin: Skin is  warm and dry. He is not diaphoretic. There is erythema ( see HENT exam).  Psychiatric: He has a normal mood and affect. His behavior is normal.  Nursing note and vitals reviewed.   Urgent Care Course   Clinical Course     .Marland Kitchen.Incision and Drainage Date/Time: 06/16/2016 10:27 AM Performed by: Junius Finner'MALLEY, Leilanny Fluitt Authorized by: Donna ChristenBEESE, STEPHEN A   Consent:    Consent obtained:  Verbal   Consent given by:  Patient   Risks discussed:  Bleeding, incomplete drainage, pain and infection   Alternatives discussed:  Alternative treatment Location:    Type:  Abscess   Size:  0.5   Location:   Head   Head location:  Face Pre-procedure details:    Skin preparation:  Antiseptic wash Anesthesia (see MAR for exact dosages):    Anesthesia method:  Local infiltration   Local anesthetic:  Lidocaine 1% w/o epi Procedure type:    Complexity:  Simple Procedure details:    Needle aspiration: no     Incision types:  Single straight   Incision depth:  Subcutaneous   Scalpel blade:  11   Wound management:  Irrigated with saline   Drainage:  Bloody   Drainage amount:  Scant   Wound treatment:  Wound left open   Packing materials:  None Post-procedure details:    Patient tolerance of procedure:  Tolerated well, no immediate complications   (including critical care time)  Labs Review Labs Reviewed - No data to display  Imaging Review No results found.    MDM   1. Cutaneous abscess of face    Small facial abscess that has suddenly worsened over 2 days.  I&D performed w/o immediate complication. encouraged warm compresses at home.  Rx: Doxycycline F/u with PCP or return to UC in 3-4 days if not improving, sooner if worsening.    Junius Finnerrin O'Malley, PA-C 06/16/16 1029

## 2016-06-19 DIAGNOSIS — L0201 Cutaneous abscess of face: Secondary | ICD-10-CM | POA: Diagnosis not present

## 2016-06-23 MED FILL — DOXYCYCLINE HYCLATE 100 MG: 100 | 7 days supply | Qty: 14 | Fill #0

## 2016-06-30 MED FILL — AVONEX PREFILLED SYR 30 MCG: 30 | 28 days supply | Qty: 1 | Fill #6

## 2016-08-08 MED FILL — AVONEX PREFILLED SYR 30 MCG: 30 | 28 days supply | Qty: 1 | Fill #7

## 2016-08-28 DIAGNOSIS — L219 Seborrheic dermatitis, unspecified: Secondary | ICD-10-CM | POA: Diagnosis not present

## 2016-08-28 DIAGNOSIS — L570 Actinic keratosis: Secondary | ICD-10-CM | POA: Diagnosis not present

## 2016-09-06 MED FILL — AVONEX PREFILLED SYR 30 MCG: 30 | 28 days supply | Qty: 1 | Fill #8

## 2016-09-07 ENCOUNTER — Ambulatory Visit (HOSPITAL_COMMUNITY)
Admission: RE | Admit: 2016-09-07 | Discharge: 2016-09-07 | Disposition: A | Discharge status: Home / Self Care | Payer: 59 | Source: Ambulatory Visit | Attending: Neurology | Admitting: Neurology

## 2016-09-07 DIAGNOSIS — G35 Multiple sclerosis: Secondary | ICD-10-CM | POA: Insufficient documentation

## 2016-09-07 DIAGNOSIS — M47812 Spondylosis without myelopathy or radiculopathy, cervical region: Secondary | ICD-10-CM | POA: Diagnosis not present

## 2016-09-07 MED ORDER — GADOBENATE DIMEGLUMINE 529 MG/ML IV SOLN
20.0000 mL | Freq: Once | INTRAVENOUS | Status: AC | PRN
  Administered 2016-09-07: 20 mL via INTRAVENOUS

## 2016-09-08 ENCOUNTER — Telehealth: Payer: Self-pay | Admitting: Neurology

## 2016-09-08 ENCOUNTER — Ambulatory Visit: Payer: 59 | Admitting: Nurse Practitioner

## 2016-09-08 NOTE — Telephone Encounter (Signed)
I called patient. MRI the brain and cervical spine show very little change from 2010. The patient appears to be quite stable on Avonex, would continue the medication.   MRI brain and cervical 09/07/16:  IMPRESSION: MRI HEAD IMPRESSION:  1. Patchy cerebral white matter disease, consistent with history of multiple sclerosis. Overall, findings are not significantly changed relative to most recent MRI from 09/07/2008. No new lesions. No evidence for active demyelination. 2. No other acute intracranial process identified.  MRI CERVICAL SPINE IMPRESSION:  1. Patchy cord signal abnormality within the upper cervical spinal cord at the level of C2, C3, and C4, compatible with history of multiple sclerosis. Overall, findings are not significantly changed from previous. No new lesions. No evidence for active demyelination. 2. Mild multilevel degenerative spondylolysis as above without significant stenosis.

## 2016-10-13 MED FILL — AVONEX PREFILLED SYR 30 MCG: 30 | 28 days supply | Qty: 1 | Fill #9

## 2016-11-04 DIAGNOSIS — L309 Dermatitis, unspecified: Secondary | ICD-10-CM | POA: Diagnosis not present

## 2016-11-04 MED FILL — CLOBETASOL 0.05% CREAM: 0.05 | 20 days supply | Qty: 60 | Fill #0

## 2016-11-04 MED FILL — MUPIROCIN 2% OINTMENT: 2 | 10 days supply | Qty: 22 | Fill #0

## 2016-11-17 ENCOUNTER — Other Ambulatory Visit: Payer: Self-pay | Admitting: Nurse Practitioner

## 2016-11-17 ENCOUNTER — Other Ambulatory Visit: Payer: Self-pay | Admitting: Internal Medicine

## 2016-11-17 NOTE — Telephone Encounter (Signed)
This pt last seen with [place] 08/2015, saw [facility] clinician 11/2015.  MRI done and reviewed by you 08-2016.  Ok to renew avonex

## 2016-11-18 ENCOUNTER — Other Ambulatory Visit: Payer: Self-pay | Admitting: Pharmacist

## 2016-11-18 MED ORDER — INTERFERON BETA-1A 30 MCG/0.5ML IM PSKT: PREFILLED_SYRINGE | INTRAMUSCULAR | 11 refills | Status: DC

## 2016-11-18 MED FILL — AVONEX PREFILLED SYR 30 MCG: 30 | 27 days supply | Qty: 1 | Fill #0

## 2016-12-16 ENCOUNTER — Emergency Department (INDEPENDENT_AMBULATORY_CARE_PROVIDER_SITE_OTHER)
Admission: EM | Admit: 2016-12-16 | Discharge: 2016-12-16 | Disposition: A | Discharge status: Home / Self Care | Payer: 59 | Source: Home / Self Care | Attending: Family Medicine | Admitting: Family Medicine

## 2016-12-16 ENCOUNTER — Encounter: Payer: Self-pay | Admitting: Emergency Medicine

## 2016-12-16 DIAGNOSIS — L03011 Cellulitis of right finger: Secondary | ICD-10-CM | POA: Diagnosis not present

## 2016-12-16 DIAGNOSIS — R21 Rash and other nonspecific skin eruption: Secondary | ICD-10-CM

## 2016-12-16 MED ORDER — CLINDAMYCIN HCL 300 MG PO CAPS: 300.0000 mg | ORAL_CAPSULE | Freq: Four times a day (QID) | ORAL | 0 refills | Status: DC

## 2016-12-16 MED ORDER — TERBINAFINE HCL 250 MG PO TABS: 250.0000 mg | ORAL_TABLET | Freq: Every day | ORAL | 1 refills | Status: DC

## 2016-12-16 NOTE — ED Triage Notes (Signed)
Paronychia x 1 day

## 2016-12-16 NOTE — ED Provider Notes (Signed)
CSN: [id number]     Arrival date & time 12/16/16  1937 History   None    Chief Complaint  Patient presents with  . Nail Problem   (Consider location/radiation/quality/duration/timing/severity/associated sxs/prior Treatment) HPI Dennis Leonard is a 51 y.o. male presenting to UC with c/o pain, swelling, and redness to his Right middle finger that started yesterday.  Pain is aching and sore. No drainage or bleeding yet. 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  Pt also c/o chronic erythematous circular lesions on Left lower leg and Left arm. He has seen a dermatologist for the last 6 months and has been prescribed creams but no relief. Denies fever, chills, n/v/d.    Past Medical History:  Diagnosis Date  . Low back pain   . Multiple sclerosis, relapsing-remitting (HCC) 07/23/2006 dx   Past Surgical History:  Procedure Laterality Date  . APPENDECTOMY  1994  . TONSILLECTOMY  1974   Family History  Problem Relation Age of Onset  . Breast cancer Mother 22  . Hyperlipidemia Mother   . Alcohol abuse Maternal Uncle   . Lung cancer Maternal Aunt 18   Social History  Substance Use Topics  . Smoking status: Former Smoker    Types: Cigarettes    Quit date: 06/22/2004  . Smokeless tobacco: Never Used     Comment: JD, Optometrist. 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 Alcohol use No    Review of Systems  Constitutional: Negative for chills and fever.  Musculoskeletal: Positive for arthralgias and joint swelling. Negative for myalgias.  Skin: Positive for color change and rash. Negative for wound.    Allergies  Patient has no known allergies.  Home Medications   Prior to Admission medications   Medication Sig Start Date End Date Taking? Authorizing Provider  acetaminophen (TYLENOL) 500 MG tablet Take 1-2 tablets (500-1,000 mg total) by mouth every 6 (six) hours as needed for pain. 07/27/11   Moses Manners, MD  clindamycin (CLEOCIN) 300 MG capsule Take 1 capsule (300 mg total)  by mouth 4 (four) times daily. X 7 days 12/16/16   Lurene Shadow, PA-C  ibuprofen (ADVIL,MOTRIN) 200 MG tablet Take 2-3 tablets (400-600 mg total) by mouth every 6 (six) hours as needed for pain. 07/27/11   Moses Manners, MD  interferon beta-1a (AVONEX PREFILLED) 30 MCG/0.5ML PSKT injection INJECT 0.5 MLS INTO THE MUSCLE EVERY 7 DAYS. 11/18/16   Quentin Angst, MD  terbinafine (LAMISIL) 250 MG tablet Take 1 tablet (250 mg total) by mouth daily. For up to 6 weeks. 12/16/16   Lurene Shadow, PA-C   Meds Ordered and Administered this Visit  Medications - No data to display  BP (!) 144/89 (BP Location: Left Arm)   Pulse 77   Temp 97.9 F (36.6 C) (Oral)   Ht 6' (1.829 m)   Wt 215 lb (97.5 kg)   SpO2 94%   BMI 29.16 kg/m  No data found.   Physical Exam  Constitutional: He is oriented to person, place, and time. He appears well-developed and well-nourished. No distress.  HENT:  Head: Normocephalic and atraumatic.  Eyes: EOM are normal.  Neck: Normal range of motion.  Cardiovascular: Normal rate.   Pulmonary/Chest: Effort normal.  Musculoskeletal: Normal range of motion. He exhibits edema and tenderness.       Hands: Right middle finger, distal aspect: mild edema, tenderness. Full ROM (see skin exam)  Neurological: He is alert and oriented to person,  place, and time.  Skin: Skin is warm and dry. He is not diaphoretic.     Right middle finger: erythema, edema, and mild fluctuance and whiting of skin around nailbed on radial side. No active bleeding or discharge. Left arm and Left lower leg: sparse quarter-sized circular erythematous lesions with centralized dried flaking skin. Non-tender.   Psychiatric: He has a normal mood and affect. His behavior is normal.  Nursing note and vitals reviewed.   Urgent Care Course     .Marland KitchenIncision and Drainage Date/Time: 12/18/2016 8:14 AM Performed by: Lurene Shadow Authorized by: Donna Christen A   Consent:    Consent obtained:   Verbal   Consent given by:  Patient   Risks discussed:  Bleeding, incomplete drainage, infection and pain   Alternatives discussed:  Delayed treatment Location:    Type:  Abscess   Size:  1cm   Location:  Upper extremity   Upper extremity location:  Finger   Finger location:  R long finger Pre-procedure details:    Procedure prep: alcohol swab. Anesthesia (see MAR for exact dosages):    Anesthesia method:  Topical application   Topical anesthesia: cold spray. Procedure type:    Complexity:  Simple Procedure details:    Needle aspiration: no     Incision types:  Single straight   Incision depth:  Subcutaneous   Scalpel blade:  11   Drainage:  Bloody and purulent   Drainage amount:  Scant   Wound treatment:  Wound left open   Packing material: bacitracin and bandage. Post-procedure details:    Patient tolerance of procedure:  Tolerated well, no immediate complications   (including critical care time)  Labs Review Labs Reviewed  WOUND CULTURE   Narrative:    Performed at:  [facility]                [address], [address]                [place], [place] [address]    Imaging Review No results found.    MDM   1. Paronychia of right middle finger   2. Rash    Hx and exam of Right middle finger c/w paronychi. I&D performed w/o immediate complication  Due to hx of MRSA, will start on antibiotics.  He was on doxycycline in the past but states that did not help with prior infection, will start pt on Clindamycin  Chronic rash on Left arm and lower leg appears most c/w a tinea infection.  Due to reports of no improvement with topical therapy prescribed by dermatology, will prescribe Terbinafine 250mg  PO for up to 6 weeks. Encouraged f/u with PCP for ongoing monitoring of symptoms and any side effects of taking the medication. Patient verbalized understanding and agreement with treatment plan.     Lurene Shadow, [place] 12/18/16 [phone]

## 2016-12-19 ENCOUNTER — Telehealth: Payer: Self-pay | Admitting: Emergency Medicine

## 2016-12-19 LAB — WOUND CULTURE

## 2016-12-19 NOTE — Telephone Encounter (Signed)
Left a message for patient in regards to lab results. Requested that patient call [facility] back.

## 2016-12-26 MED FILL — AVONEX PREFILLED SYR 30 MCG: 30 | 27 days supply | Qty: 1 | Fill #1

## 2017-02-02 MED FILL — AVONEX PREFILLED SYR 30 MCG: 30 | 27 days supply | Qty: 1 | Fill #2

## 2017-02-05 DIAGNOSIS — L300 Nummular dermatitis: Secondary | ICD-10-CM | POA: Diagnosis not present

## 2017-02-05 DIAGNOSIS — L0889 Other specified local infections of the skin and subcutaneous tissue: Secondary | ICD-10-CM | POA: Diagnosis not present

## 2017-02-09 MED FILL — CEPHALEXIN 500 MG CAPSULE: 500 | 10 days supply | Qty: 30 | Fill #0

## 2017-02-09 MED FILL — MUPIROCIN 2% OINTMENT: 2 | 5 days supply | Qty: 22 | Fill #0

## 2017-03-07 MED FILL — AVONEX PREFILLED SYR 30 MCG: 30 | 27 days supply | Qty: 1 | Fill #3

## 2017-03-09 DIAGNOSIS — L300 Nummular dermatitis: Secondary | ICD-10-CM | POA: Diagnosis not present

## 2017-03-09 DIAGNOSIS — L0109 Other impetigo: Secondary | ICD-10-CM | POA: Diagnosis not present

## 2017-03-09 MED FILL — CEPHALEXIN 500 MG CAPSULE: 500 | 30 days supply | Qty: 60 | Fill #0

## 2017-03-09 MED FILL — TACROLIMUS 0.1 % OINT: 0.1 | 30 days supply | Qty: 60 | Fill #0

## 2017-04-03 ENCOUNTER — Emergency Department (INDEPENDENT_AMBULATORY_CARE_PROVIDER_SITE_OTHER)
Admission: EM | Admit: 2017-04-03 | Discharge: 2017-04-03 | Disposition: A | Discharge status: Home / Self Care | Payer: 59 | Source: Home / Self Care

## 2017-04-03 DIAGNOSIS — Z23 Encounter for immunization: Secondary | ICD-10-CM

## 2017-04-03 MED ORDER — INFLUENZA VAC SPLIT QUAD 0.5 ML IM SUSY
0.5000 mL | PREFILLED_SYRINGE | INTRAMUSCULAR | Status: AC
  Administered 2017-04-03: 0.5 mL via INTRAMUSCULAR

## 2017-04-10 MED FILL — AVONEX PREFILLED SYR 30 MCG: 30 | 27 days supply | Qty: 1 | Fill #4

## 2017-04-30 DIAGNOSIS — L409 Psoriasis, unspecified: Secondary | ICD-10-CM | POA: Diagnosis not present

## 2017-04-30 DIAGNOSIS — L0889 Other specified local infections of the skin and subcutaneous tissue: Secondary | ICD-10-CM | POA: Diagnosis not present

## 2017-04-30 DIAGNOSIS — L400 Psoriasis vulgaris: Secondary | ICD-10-CM | POA: Diagnosis not present

## 2017-04-30 DIAGNOSIS — L0109 Other impetigo: Secondary | ICD-10-CM | POA: Diagnosis not present

## 2017-05-10 ENCOUNTER — Telehealth: Payer: Self-pay | Admitting: Neurology

## 2017-05-10 NOTE — Telephone Encounter (Signed)
LVM informing patient that per Enid Skeens NP's information, there are no interactions between Avonex and methotrexate or Avonex and Stelara. Repeated this information, left number for any questions.

## 2017-05-10 NOTE — Telephone Encounter (Signed)
Pt called back, he is wanting a provider to call his dermatologist Dr Doreen [facility]amWhitworth [facility] @ [phone] to discuss this.

## 2017-05-10 NOTE — Telephone Encounter (Signed)
Pt called said his dermatologist dx him with psorisis and is needing to know if avonex (MS medication) will inter with metotraxte or stellara. 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

## 2017-05-10 NOTE — Telephone Encounter (Signed)
No interactions between Avonex and methotrexate.  Could not pull up the other one, questionable spelling

## 2017-05-11 NOTE — Telephone Encounter (Signed)
Received call back from [place], RN at Dr [facility] office. Advised her that Dennis Skeens, NP did not find any drug interactions between Avonex and methotrexate or Stelara on her resources for drug interactions. Melissa repeated information correctly stated she would inform Dr Doreen Beam, verbalized understanding.

## 2017-05-11 NOTE — Telephone Encounter (Signed)
Called dermatologist's office, LVM on nurse's line advising that patient had requested this office call Dr Doreen Beam re: medications he prescribed. Left office number.

## 2017-05-14 MED FILL — CALCIPOTRIENE 0.005% CREAM: 0.005 | 30 days supply | Qty: 120 | Fill #0

## 2017-05-24 MED FILL — AVONEX PREFILLED SYR 30 MCG: 30 | 27 days supply | Qty: 1 | Fill #5

## 2017-06-12 DIAGNOSIS — H5213 Myopia, bilateral: Secondary | ICD-10-CM | POA: Diagnosis not present

## 2017-07-01 ENCOUNTER — Telehealth: Payer: Self-pay | Admitting: Neurology

## 2017-07-01 NOTE — Telephone Encounter (Signed)
LVM for patient informing him this RN will call [facility] outpt pharmacy re: Avonex PA since he was last seen in this office almost 2 years ago. Also this office did not refill Avonex the most recent time.  Called [facility] pharmacy, spoke with pharmacist. Informed her the patient has not been in this office since March 2017. Advised they contact prescribing Dr Lynnea Maizes who last refilled Avonex to obtain PA.  Pharmacist verbalized understanding.

## 2017-07-01 NOTE — Telephone Encounter (Signed)
PA is needed for interferon beta-1a (AVONEX PREFILLED) 30 MCG/0.5ML PSKT injection for [employer] pt

## 2017-07-05 ENCOUNTER — Encounter: Payer: Self-pay | Admitting: Nurse Practitioner

## 2017-07-05 ENCOUNTER — Ambulatory Visit (INDEPENDENT_AMBULATORY_CARE_PROVIDER_SITE_OTHER): Payer: 59 | Admitting: Nurse Practitioner

## 2017-07-05 VITALS — BP 140/86 | HR 98 | Wt 229.0 lb

## 2017-07-05 DIAGNOSIS — G35 Multiple sclerosis: Secondary | ICD-10-CM

## 2017-07-05 DIAGNOSIS — Z5181 Encounter for therapeutic drug level monitoring: Secondary | ICD-10-CM | POA: Diagnosis not present

## 2017-07-05 MED ORDER — INTERFERON BETA-1A 30 MCG/0.5ML IM PSKT: PREFILLED_SYRINGE | INTRAMUSCULAR | 11 refills | Status: DC

## 2017-07-05 NOTE — Progress Notes (Signed)
I have read the note, and I agree with the clinical assessment and plan.  Carmine Youngberg K Tylar Amborn   

## 2017-07-05 NOTE — Progress Notes (Signed)
[facility]  PATIENT: Dennis Leonard DOB: 1965-09-23   REASON FOR VISIT: For multiple sclerosis, relapsing remitting HISTORY FROM: Patient    HISTORY OF PRESENT ILLNESS:UPDATE 1/14/2019CM  Dennis Leonard, 52 year old male returns for follow-up with history of relapsing remitting multiple sclerosis.  He has been on Avonex for a number of years and is tolerated medication well.  He denies any injection site issues has not had any exacerbation of symptoms in quite some time.  MRI of the brain 02/07/2017 without change from 2010.  MRI of the cervical spine with mild multilevel degenerative no significant stenosis.  Patient denies any visual changes weakness balance changes no new numbness.  He has not had any problems with bowel or bladder control.  He uses a treadmill intermittently for exercise.  09/09/15 CMMr. Dennis Leonard is a 52 year old right-handed white male with a history of multiple sclerosis. The patient has been on Avonex for number of years, and he has tolerated the medication well. He has not had any clinical MS attacks in at last 6 years. The last MRI of the brain and cervical spinal cord was done in 2010.  The patient does have a history of low back pain as well, but he indicates that he has started to work out on a regular basis, and his back discomfort has improved over time. He has Skelaxin at home to take if he needs it. The patient again reports no new numbness, weakness, balance changes, bowel or bladder control issues, or visual changes. He returns for an evaluation. He uses his treadmill every day and he occasionally will note some clumsiness with his left leg with ambulation. He returns for reevaluation    REVIEW OF SYSTEMS: Full 14 system review of systems performed and notable only for those listed, all others are neg:  Constitutional: neg  Cardiovascular: neg Ear/Nose/Throat: neg  Skin: neg Eyes: neg Respiratory: neg Gastroitestinal: neg    Hematology/Lymphatic: neg  Endocrine: neg Musculoskeletal:neg Allergy/Immunology: neg Neurological: neg Psychiatric: neg Sleep : neg   ALLERGIES: No Known Allergies  HOME MEDICATIONS: Outpatient Medications Prior to Visit  Medication Sig Dispense Refill  . interferon beta-1a (AVONEX PREFILLED) 30 MCG/0.5ML PSKT injection INJECT 0.5 MLS INTO THE MUSCLE EVERY 7 DAYS. 3 each 11  . acetaminophen (TYLENOL) 500 MG tablet Take 1-2 tablets (500-1,000 mg total) by mouth every 6 (six) hours as needed for pain.    Marland Kitchen ibuprofen (ADVIL,MOTRIN) 200 MG tablet Take 2-3 tablets (400-600 mg total) by mouth every 6 (six) hours as needed for pain.    . clindamycin (CLEOCIN) 300 MG capsule Take 1 capsule (300 mg total) by mouth 4 (four) times daily. X 7 days 28 capsule 0  . terbinafine (LAMISIL) 250 MG tablet Take 1 tablet (250 mg total) by mouth daily. For up to 6 weeks. 30 tablet 1   No facility-administered medications prior to visit.     PAST MEDICAL HISTORY: Past Medical History:  Diagnosis Date  . Low back pain   . Multiple sclerosis, relapsing-remitting (HCC) 07/23/2006 dx    PAST SURGICAL HISTORY: Past Surgical History:  Procedure Laterality Date  . APPENDECTOMY  1994  . TONSILLECTOMY  1974    FAMILY HISTORY: Family History  Problem Relation Age of Onset  . Breast cancer Mother 56  . Hyperlipidemia Mother   . Alcohol abuse Maternal Uncle   . Lung cancer Maternal Aunt 28    SOCIAL HISTORY: Social History   Socioeconomic History  . Marital status: Married  Spouse name: Not on file  . Number of children: 2  . Years of education: COLLEGE  . Highest education level: Not on file  Social Needs  . Financial resource strain: Not on file  . Food insecurity - worry: Not on file  . Food insecurity - inability: Not on file  . Transportation needs - medical: Not on file  . Transportation needs - non-medical: Not on file  Occupational History  . Occupation: JUDGE  Tobacco Use  .  Smoking status: Former Smoker    Types: Cigarettes    Last attempt to quit: 06/22/2004    Years since quitting: 13.0  . Smokeless tobacco: Never Used  . Tobacco comment: JD, criminal Therapist, occupational. Married, lives with wife and 2 sons  Substance and Sexual Activity  . Alcohol use: No  . Drug use: No  . Sexual activity: Not on file  Other Topics Concern  . Not on file  Social History Narrative   Patient drinks 1-2 cups of caffeine daily.   Patient is right handed.     PHYSICAL EXAM  Vitals:   07/05/17 1513  BP: 140/86  Pulse: 98  Weight: 229 lb (103.9 kg)   Body mass index is 31.06 kg/m.  Generalized: Well developed, in no acute distress  Head: normocephalic and atraumatic,. Oropharynx benign  Neck: Supple,  Musculoskeletal: No deformity   Neurological examination   Mentation: Alert oriented to time, place, history taking. Attention span and concentration appropriate. Recent and remote memory intact.  Follows all commands speech and language fluent.   Cranial nerve II-XII: Pupils were equal round reactive to light extraocular movements were full, visual field were full on confrontational test. Facial sensation and strength were normal. hearing was intact to finger rubbing bilaterally. Uvula tongue midline. Head turning and shoulder shrug were normal and symmetric.Tongue protrusion into cheek strength was normal. Motor: normal bulk and tone, full strength in the BUE, BLE, fine finger movements normal, no pronator drift. No focal weakness Sensory: normal and symmetric to light touch, pinprick, and  Vibration,  Coordination: finger-nose-finger, heel-to-shin bilaterally, no dysmetria Reflexes: Brachioradialis 2/2, biceps 2/2, triceps 2/2, patellar 2/2, Achilles 2/2, plantar responses were flexor bilaterally. Gait and Station: Rising up from seated position without assistance, normal stance,  moderate stride, good arm swing, smooth turning, able to perform tiptoe, and heel walking  without difficulty. Tandem gait is steady  DIAGNOSTIC DATA (LABS, IMAGING, TESTING) - ASSESSMENT AND PLAN  52 y.o. year old male  has a past medical history of Low back pain and Multiple sclerosis, relapsing-remitting (HCC) (07/23/2006 dx). here to follow-up. No exacerbation of MS symptoms in  7 years.  MRI of the brain in MRI cervical spine stable March 2018  Continue Avonex weekly will renew Labs today, CBC and CMP Follow-up yearly Call for exacerbation of symptoms Nilda Riggs, [facility], [facility], APRN  [facility] [address] [place], [place] [address] [phone]

## 2017-07-05 NOTE — Patient Instructions (Signed)
Continue Avonex weekly will renew Labs today, CBC and CMP Follow-up yearly

## 2017-07-06 ENCOUNTER — Other Ambulatory Visit: Payer: Self-pay | Admitting: Pharmacist

## 2017-07-06 LAB — CBC WITH DIFFERENTIAL/PLATELET
BASOS: 0 %
Basophils Absolute: 0 10*3/uL (ref 0.0–0.2)
EOS (ABSOLUTE): 0.1 10*3/uL (ref 0.0–0.4)
Eos: 1 %
Hematocrit: 46.7 % (ref 37.5–51.0)
Hemoglobin: 15.9 g/dL (ref 13.0–17.7)
IMMATURE GRANS (ABS): 0 10*3/uL (ref 0.0–0.1)
IMMATURE GRANULOCYTES: 1 %
LYMPHS: 27 %
Lymphocytes Absolute: 1.5 10*3/uL (ref 0.7–3.1)
MCH: 31.7 pg (ref 26.6–33.0)
MCHC: 34 g/dL (ref 31.5–35.7)
MCV: 93 fL (ref 79–97)
MONOCYTES: 15 %
Monocytes Absolute: 0.8 10*3/uL (ref 0.1–0.9)
NEUTROS PCT: 56 %
Neutrophils Absolute: 3.1 10*3/uL (ref 1.4–7.0)
Platelets: 320 10*3/uL (ref 150–379)
RBC: 5.02 x10E6/uL (ref 4.14–5.80)
RDW: 13.5 % (ref 12.3–15.4)
WBC: 5.5 10*3/uL (ref 3.4–10.8)

## 2017-07-06 LAB — COMPREHENSIVE METABOLIC PANEL
ALT: 46 IU/L — AB (ref 0–44)
AST: 30 IU/L (ref 0–40)
Albumin/Globulin Ratio: 1.3 (ref 1.2–2.2)
Albumin: 4.2 g/dL (ref 3.5–5.5)
Alkaline Phosphatase: 52 IU/L (ref 39–117)
BUN/Creatinine Ratio: 11 (ref 9–20)
BUN: 11 mg/dL (ref 6–24)
Bilirubin Total: 0.4 mg/dL (ref 0.0–1.2)
CO2: 25 mmol/L (ref 20–29)
CREATININE: 0.99 mg/dL (ref 0.76–1.27)
Calcium: 9.6 mg/dL (ref 8.7–10.2)
Chloride: 101 mmol/L (ref 96–106)
GFR calc Af Amer: 103 mL/min/{1.73_m2} (ref >59)
GFR, EST NON AFRICAN AMERICAN: 89 mL/min/{1.73_m2} (ref >59)
GLUCOSE: 75 mg/dL (ref 65–99)
Globulin, Total: 3.2 g/dL (ref 1.5–4.5)
POTASSIUM: 4.4 mmol/L (ref 3.5–5.2)
Sodium: 142 mmol/L (ref 134–144)
Total Protein: 7.4 g/dL (ref 6.0–8.5)

## 2017-07-06 MED ORDER — INTERFERON BETA-1A 30 MCG/0.5ML IM PSKT: PREFILLED_SYRINGE | INTRAMUSCULAR | 11 refills | Status: DC

## 2017-07-06 NOTE — Telephone Encounter (Addendum)
Called cmm, spoke with Danielle.  PA for Avonex intiated on CMM.

## 2017-07-06 NOTE — Telephone Encounter (Signed)
Montez Morita from [employer] [facility] is asking for a call from RN re: the interferon beta-1a (AVONEX PREFILLED) 30 MCG/0.5ML PSKT injection the Ref [id number]

## 2017-07-07 ENCOUNTER — Telehealth: Payer: Self-pay | Admitting: *Deleted

## 2017-07-07 NOTE — Telephone Encounter (Signed)
Spoke with patient and informed him his labs are stable. Patient asked about PA on his medication. This RN advised the determination is still in process per CMM. patient verbalized understanding, appreciation.

## 2017-07-12 ENCOUNTER — Ambulatory Visit (HOSPITAL_BASED_OUTPATIENT_CLINIC_OR_DEPARTMENT_OTHER): Payer: 59 | Admitting: Pharmacist

## 2017-07-12 DIAGNOSIS — Z79899 Other long term (current) drug therapy: Secondary | ICD-10-CM

## 2017-07-12 NOTE — Progress Notes (Signed)
Opened in error

## 2017-07-13 ENCOUNTER — Telehealth: Payer: Self-pay | Admitting: Pharmacist

## 2017-07-13 ENCOUNTER — Ambulatory Visit (HOSPITAL_BASED_OUTPATIENT_CLINIC_OR_DEPARTMENT_OTHER): Payer: 59 | Admitting: Pharmacist

## 2017-07-13 DIAGNOSIS — Z79899 Other long term (current) drug therapy: Secondary | ICD-10-CM

## 2017-07-13 MED FILL — AVONEX PREFILLED SYR 30 MCG: 30 | 28 days supply | Qty: 1 | Fill #0

## 2017-07-13 NOTE — Telephone Encounter (Signed)
Called patient to schedule an appointment for the [facility]. I was unable to reach the patient so I left a HIPAA-compliant message requesting that the patient return my call.   

## 2017-07-13 NOTE — Progress Notes (Signed)
S: Patient presents today to the [facility].  Patient is currently taking Avonex for multiple sclerosis. Patient is managed by Dr. Anne Hahn for this.   Adherence: denies any missed doses - his wife is a Publishing rights manager and she administers the injections for him.   Current adverse effects:  Labs: WNL (including LFTs) Flu-like symptoms: denies any recent sx Neuropsychiatric side effects: denies  Last MRIs have looked good according to the patient. He is very happy with how he is doing.   O:     Lab Results  Component Value Date   WBC 5.5 07/05/2017   HGB 15.9 07/05/2017   HCT 46.7 07/05/2017   MCV 93 07/05/2017   PLT 320 07/05/2017      Chemistry      Component Value Date/Time   NA 142 07/05/2017 1551   K 4.4 07/05/2017 1551   CL 101 07/05/2017 1551   CO2 25 07/05/2017 1551   BUN 11 07/05/2017 1551   CREATININE 0.99 07/05/2017 1551      Component Value Date/Time   CALCIUM 9.6 07/05/2017 1551   ALKPHOS 52 07/05/2017 1551   AST 30 07/05/2017 1551   ALT 46 (H) 07/05/2017 1551   BILITOT 0.4 07/05/2017 1551       A/P: 1. Medication review: patient currently tolerating and stable on Avonex for MS with no adverse effects. He denies any recent relapse and believes that the Avonex is working well.  Reviewed Avonex, including injection technique and adverse effects. No recommendations for any changes.   Alvino Blood, PharmD, BCPS, BCACP, CPP Clinical Pharmacist Practitioner  [facility] [phone]

## 2017-07-13 NOTE — Telephone Encounter (Signed)
Received fax notification from [employer] about PA approval for Avonex 54mg/0.5ml for a max of 12 fills from 07/09/17-07/08/2018. Request was approved for 1 kit per 28 days.  Prior auth ref number: [id number]

## 2017-07-13 NOTE — Telephone Encounter (Signed)
Per cover my meds, Avonex is approved. No additional information available.

## 2017-07-21 MED FILL — CALCIPOTRIENE 0.005% CREAM: 0.005 | 30 days supply | Qty: 120 | Fill #1

## 2017-08-17 DIAGNOSIS — L400 Psoriasis vulgaris: Secondary | ICD-10-CM | POA: Diagnosis not present

## 2017-08-17 DIAGNOSIS — Z79899 Other long term (current) drug therapy: Secondary | ICD-10-CM | POA: Diagnosis not present

## 2017-08-19 ENCOUNTER — Other Ambulatory Visit: Payer: Self-pay

## 2017-08-19 ENCOUNTER — Encounter: Payer: Self-pay | Admitting: Physician Assistant

## 2017-08-19 ENCOUNTER — Ambulatory Visit (INDEPENDENT_AMBULATORY_CARE_PROVIDER_SITE_OTHER): Payer: 59 | Admitting: Physician Assistant

## 2017-08-19 DIAGNOSIS — L409 Psoriasis, unspecified: Secondary | ICD-10-CM | POA: Diagnosis not present

## 2017-08-19 DIAGNOSIS — G35 Multiple sclerosis: Secondary | ICD-10-CM

## 2017-08-19 NOTE — Assessment & Plan Note (Signed)
Followed by Neurology. Continue care as directed by specialist. 

## 2017-08-19 NOTE — Progress Notes (Signed)
Patient presents to clinic today to establish care.  Endorses well-balanced diet overall. Can work better on hydration per patient. Treadmill 2-3 x week.   Acute Concerns: Denies acute concerns at today's visit.   Chronic Issues: Multiple Sclerosis -- Diagnosed 11 years ago. Followed by Neurology (Dr. Anne Hahn). Is currently on a regimen of Avonex injections. Is doing very well.   Psoriasis -- Recent diagnosis. Is followed by Dr. Doreen Beam [facility]). Is not currently on medication but will be starting on Methotrexate.    Health Maintenance: Immunizations -- up-to-date.  Declines HIV screen.   Past Medical History:  Diagnosis Date  . History of chickenpox   . Low back pain   . Multiple sclerosis, relapsing-remitting (HCC) 07/23/2006 dx    Past Surgical History:  Procedure Laterality Date  . APPENDECTOMY  1994  . TONSILLECTOMY  1974    Current Outpatient Medications on File Prior to Visit  Medication Sig Dispense Refill  . ibuprofen (ADVIL,MOTRIN) 200 MG tablet Take 2-3 tablets (400-600 mg total) by mouth every 6 (six) hours as needed for pain.    Marland Kitchen interferon beta-1a (AVONEX PREFILLED) 30 MCG/0.5ML PSKT injection INJECT 0.5 MLS INTO THE MUSCLE EVERY 7 DAYS. 4 each 11   No current facility-administered medications on file prior to visit.     No Known Allergies  Family History  Problem Relation Age of Onset  . Breast cancer Mother 43  . Hyperlipidemia Mother   . Alcohol abuse Maternal Uncle   . Lung cancer Maternal Aunt 75  . Birth defects Sister     Social History   Socioeconomic History  . Marital status: Married    Spouse name: Not on file  . Number of children: 2  . Years of education: COLLEGE  . Highest education level: Not on file  Social Needs  . Financial resource strain: Not on file  . Food insecurity - worry: Not on file  . Food insecurity - inability: Not on file  . Transportation needs - medical: Not on file  . Transportation needs  - non-medical: Not on file  Occupational History  . Occupation: JUDGE  Tobacco Use  . Smoking status: Former Smoker    Types: Cigarettes    Last attempt to quit: 06/22/2004    Years since quitting: 13.1  . Smokeless tobacco: Never Used  . Tobacco comment: JD, criminal Therapist, occupational. Married, lives with wife and 2 sons  Substance and Sexual Activity  . Alcohol use: No  . Drug use: No  . Sexual activity: Yes  Other Topics Concern  . Not on file  Social History Narrative   Patient drinks 1-2 cups of caffeine daily.   Patient is right handed.   Review of Systems  Constitutional: Negative for fever and weight loss.  HENT: Negative for ear discharge, ear pain, hearing loss and tinnitus.   Eyes: Negative for blurred vision, double vision, photophobia and pain.  Respiratory: Negative for cough and shortness of breath.   Cardiovascular: Negative for chest pain and palpitations.  Gastrointestinal: Negative for abdominal pain, blood in stool, constipation, diarrhea, heartburn, melena, nausea and vomiting.  Genitourinary: Negative for dysuria, flank pain, frequency, hematuria and urgency.  Musculoskeletal: Negative for falls.  Neurological: Negative for dizziness, loss of consciousness and headaches.  Endo/Heme/Allergies: Negative for environmental allergies.  Psychiatric/Behavioral: Negative for depression, hallucinations, substance abuse and suicidal ideas. The patient is not nervous/anxious and does not have insomnia.    BP 118/80   Pulse 66   Temp 98.3 F (  36.8 C) (Oral)   Resp 16   Ht 5\' 11"  (1.803 m)   Wt 223 lb (101.2 kg)   SpO2 96%   BMI 31.10 kg/m   Physical Exam  Constitutional: He is oriented to person, place, and time and well-developed, well-nourished, and in no distress.  HENT:  Head: Normocephalic and atraumatic.  Right Ear: Tympanic membrane normal.  Left Ear: Tympanic membrane normal.  Nose: Nose normal. Right sinus exhibits no maxillary sinus tenderness and no  frontal sinus tenderness. Left sinus exhibits no maxillary sinus tenderness and no frontal sinus tenderness.  Mouth/Throat: Uvula is midline, oropharynx is clear and moist and mucous membranes are normal.  Eyes: Conjunctivae are normal.  Neck: Neck supple.  Cardiovascular: Normal rate, regular rhythm, normal heart sounds and intact distal pulses.  Pulmonary/Chest: Effort normal and breath sounds normal. No respiratory distress. He has no wheezes. He has no rales. He exhibits no tenderness.  Neurological: He is alert and oriented to person, place, and time.  Skin: Skin is warm and dry. No rash noted.  Vitals reviewed.  Recent Results (from the past 2160 hour(s))  Comprehensive metabolic panel     Status: Abnormal   Collection Time: 07/05/17  3:51 PM  Result Value Ref Range   Glucose 75 65 - 99 mg/dL   BUN 11 6 - 24 mg/dL   Creatinine, Ser 1.61 0.76 - 1.27 mg/dL   GFR calc non Af Amer 89 >59 mL/min/1.73   GFR calc Af Amer 103 >59 mL/min/1.73   BUN/Creatinine Ratio 11 9 - 20   Sodium 142 134 - 144 mmol/L   Potassium 4.4 3.5 - 5.2 mmol/L   Chloride 101 96 - 106 mmol/L   CO2 25 20 - 29 mmol/L   Calcium 9.6 8.7 - 10.2 mg/dL   Total Protein 7.4 6.0 - 8.5 g/dL   Albumin 4.2 3.5 - 5.5 g/dL   Globulin, Total 3.2 1.5 - 4.5 g/dL   Albumin/Globulin Ratio 1.3 1.2 - 2.2   Bilirubin Total 0.4 0.0 - 1.2 mg/dL   Alkaline Phosphatase 52 39 - 117 IU/L   AST 30 0 - 40 IU/L   ALT 46 (H) 0 - 44 IU/L  CBC with Differential/Platelet     Status: None   Collection Time: 07/05/17  3:51 PM  Result Value Ref Range   WBC 5.5 3.4 - 10.8 x10E3/uL   RBC 5.02 4.14 - 5.80 x10E6/uL   Hemoglobin 15.9 13.0 - 17.7 g/dL   Hematocrit 09.6 04.5 - 51.0 %   MCV 93 79 - 97 fL   MCH 31.7 26.6 - 33.0 pg   MCHC 34.0 31.5 - 35.7 g/dL   RDW 40.9 81.1 - 91.4 %   Platelets 320 150 - 379 x10E3/uL   Neutrophils 56 Not Estab. %   Lymphs 27 Not Estab. %   Monocytes 15 Not Estab. %   Eos 1 Not Estab. %   Basos 0 Not Estab. %    Neutrophils Absolute 3.1 1.4 - 7.0 x10E3/uL   Lymphocytes Absolute 1.5 0.7 - 3.1 x10E3/uL   Monocytes Absolute 0.8 0.1 - 0.9 x10E3/uL   EOS (ABSOLUTE) 0.1 0.0 - 0.4 x10E3/uL   Basophils Absolute 0.0 0.0 - 0.2 x10E3/uL   Immature Granulocytes 1 Not Estab. %   Immature Grans (Abs) 0.0 0.0 - 0.1 x10E3/uL   Assessment/Plan: Multiple sclerosis, relapsing-remitting Followed by Neurology. Continue care as directed by specialist.   Psoriasis Recent diagnosis. Followed by Dermatology. Has upcoming appointment to discuss medications.  Leeanne Rio, PA-C

## 2017-08-19 NOTE — Patient Instructions (Signed)
Please reconsider letting me check your fasting cholesterol levels.  Increase fluid intake. Keep a well-balanced diet and get plenty of rest.   Please come see [place] yearly for a physical and as-needed for acute visits. Follow-up with specialists as scheduled. Welcome to [facility]!

## 2017-08-19 NOTE — Assessment & Plan Note (Addendum)
Recent diagnosis. Followed by Dermatology. Has upcoming appointment to discuss medications.

## 2017-08-23 MED FILL — AVONEX PREFILLED SYR 30 MCG: 30 | 28 days supply | Qty: 1 | Fill #1

## 2017-08-26 MED FILL — METHOTREXATE SODIUM 2.5 MG: 2.5 | 14 days supply | Qty: 6 | Fill #0

## 2017-08-26 MED FILL — FOLIC ACID 1 MG TABLET: 1 | 32 days supply | Qty: 30 | Fill #0

## 2017-09-20 DIAGNOSIS — L400 Psoriasis vulgaris: Secondary | ICD-10-CM | POA: Diagnosis not present

## 2017-09-20 DIAGNOSIS — Z79899 Other long term (current) drug therapy: Secondary | ICD-10-CM | POA: Diagnosis not present

## 2017-09-21 MED FILL — METHOTREXATE SODIUM 2.5 MG: 2.5 | 14 days supply | Qty: 12 | Fill #0

## 2017-10-04 MED FILL — AVONEX PREFILLED SYR 30 MCG: 30 | 28 days supply | Qty: 1 | Fill #2

## 2017-10-05 DIAGNOSIS — Z79899 Other long term (current) drug therapy: Secondary | ICD-10-CM | POA: Diagnosis not present

## 2017-10-05 DIAGNOSIS — L400 Psoriasis vulgaris: Secondary | ICD-10-CM | POA: Diagnosis not present

## 2017-10-07 MED FILL — METHOTREXATE SODIUM 2.5 MG: 2.5 | 28 days supply | Qty: 24 | Fill #0

## 2017-10-07 MED FILL — FOLIC ACID 1 MG TABLET: 1 | 28 days supply | Qty: 24 | Fill #1

## 2017-10-11 IMAGING — MR MR HEAD WO/W CM
6 of 21 series · 25 of 48 positions shown · IV contrast (multihance)
Comparison: Comparison made with most recent MRI from 09/07/2008.

CLINICAL DATA: Initial evaluation for multiple sclerosis. No new
symptoms.

EXAM:
MRI HEAD WITHOUT AND WITH CONTRAST
MRI CERVICAL SPINE WITHOUT AND WITH CONTRAST
TECHNIQUE: Multiplanar, multiecho pulse sequences of the brain and surrounding
structures, and cervical spine, to include the craniocervical
junction and cervicothoracic junction, were obtained without and
with intravenous contrast.
CONTRAST:  20mL MULTIHANCE GADOBENATE DIMEGLUMINE 529 MG/ML IV SOLN

[Series 4: DWI · axial · 3.0mm · 1.09mm/px · z∈[-20,+144]mm · 4 of 112 slices shown (1 of 4)]
[im 1/112]
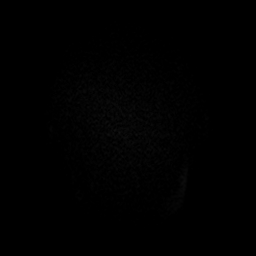
[im 38/112]
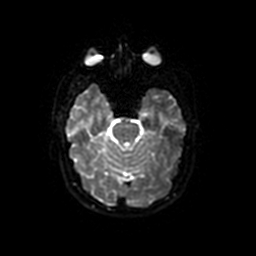
[im 75/112]
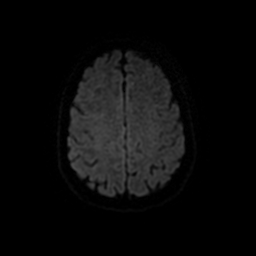
[im 112/112]
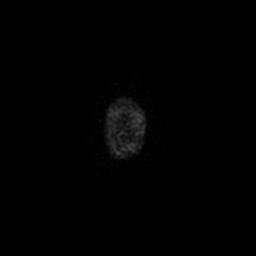

[Series 5: DWI · coronal · 5.0mm · 1.09mm/px · 3 of 78 slices shown (2 of 4)]
[im 1/78]
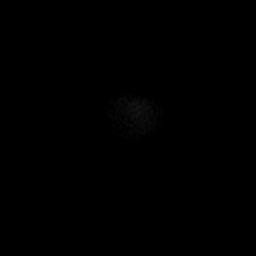
[im 39/78]
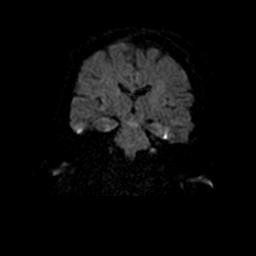
[im 78/78]
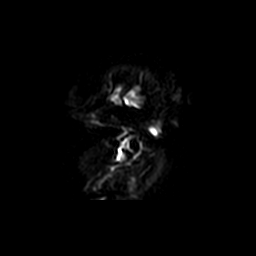

[Series 7: FLAIR · axial · 3.0mm · 0.43mm/px · z∈[-4,+150]mm · 2 of 53 slices shown (1 of 2)]
[im 1/53]
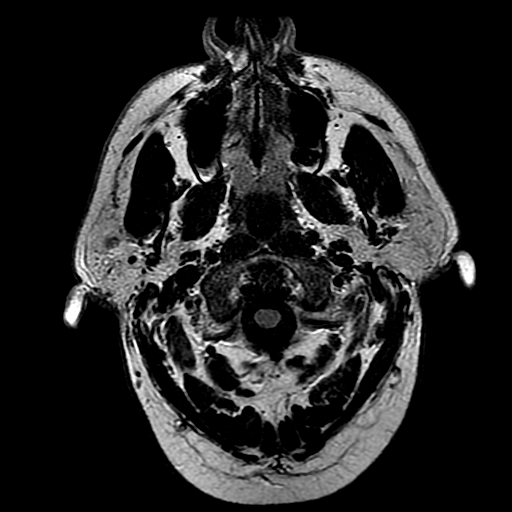
[im 53/53]
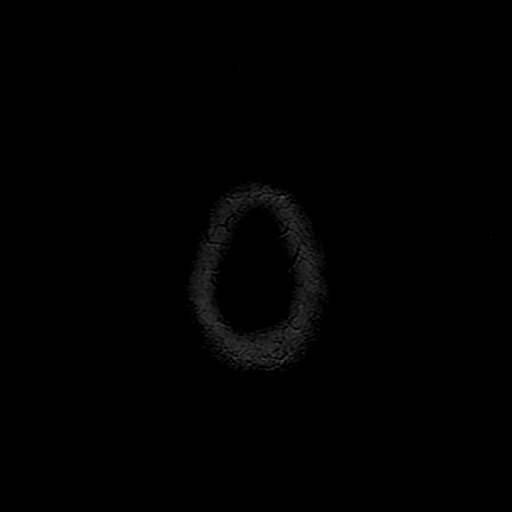

[Series 10: FLAIR · sagittal · 1.6mm · 0.47mm/px · 11 of 224 slices shown (2 of 2)]
[im 1/224]
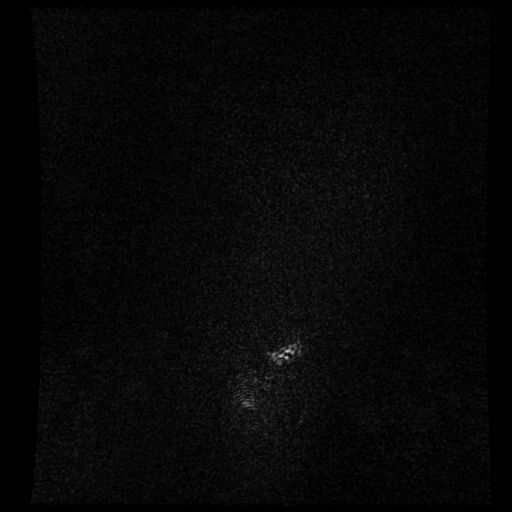
[im 23/224]
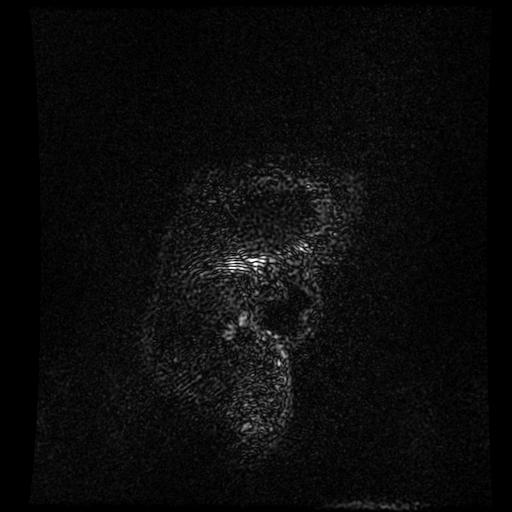
[im 45/224]
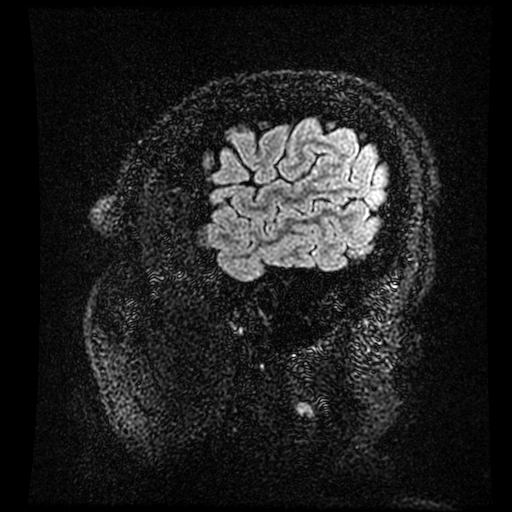
[im 67/224]
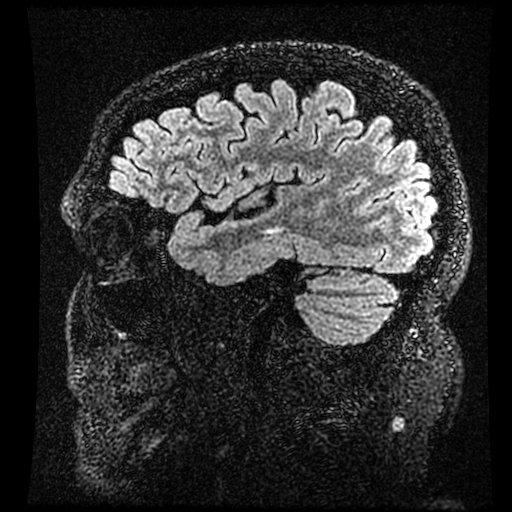
[im 90/224]
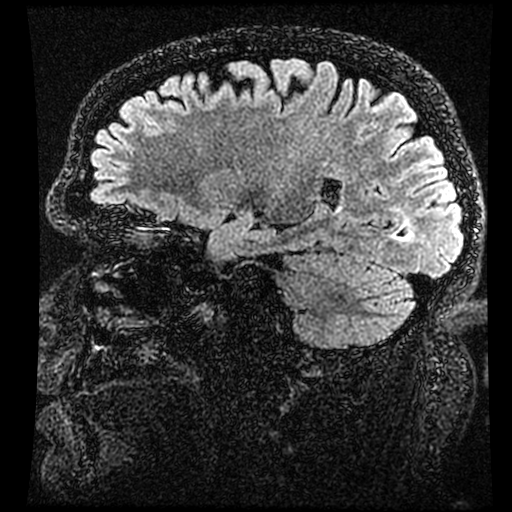
[im 112/224]
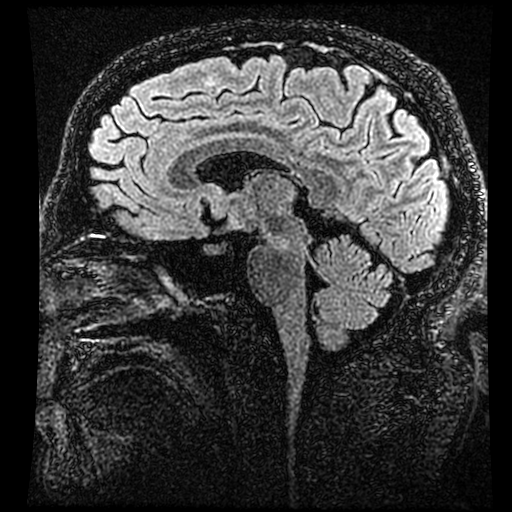
[im 134/224]
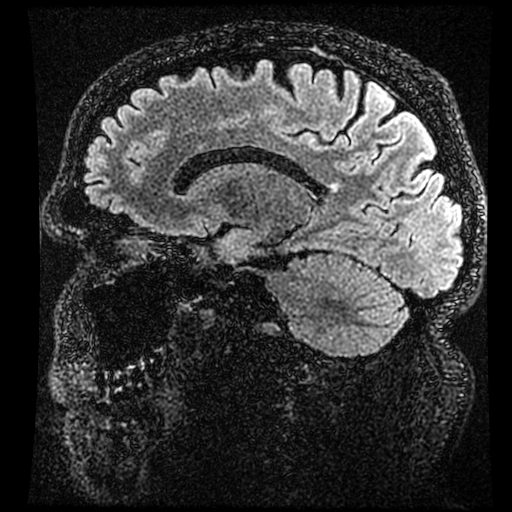
[im 157/224]
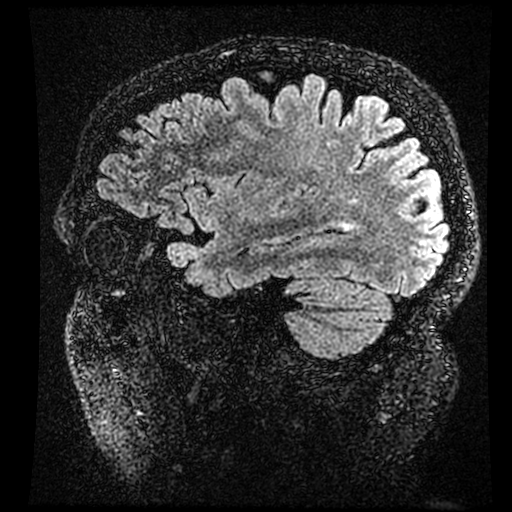
[im 179/224]
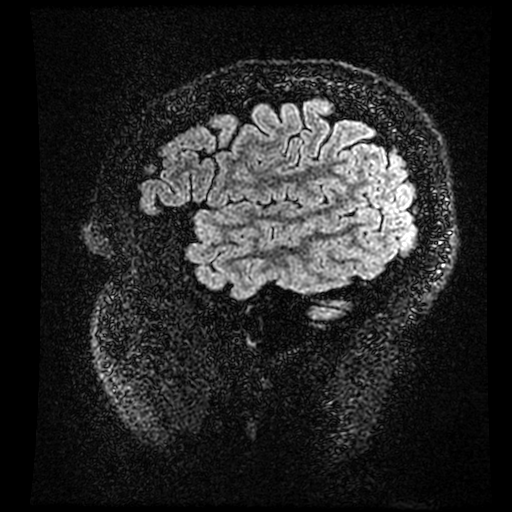
[im 201/224]
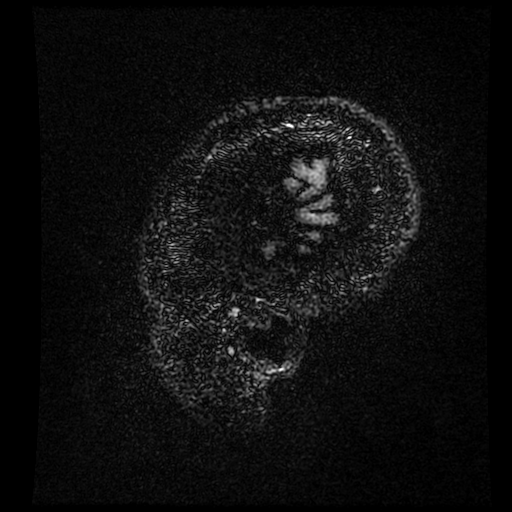
[im 224/224]
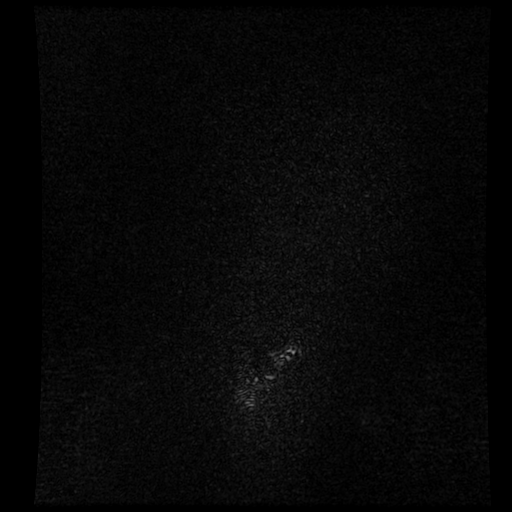

[Series 400: DWI · axial · 3.0mm · 1.09mm/px · z∈[-20,+144]mm · 3 of 56 slices shown (3 of 4)]
[im 1/56]
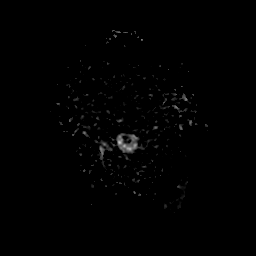
[im 28/56]
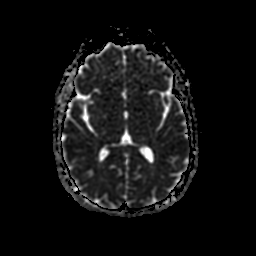
[im 56/56]
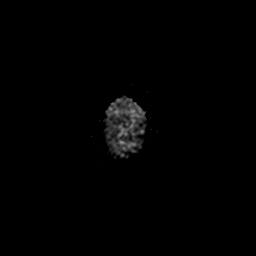

[Series 500: DWI · coronal · 5.0mm · 1.09mm/px · 2 of 39 slices shown (4 of 4)]
[im 1/39]
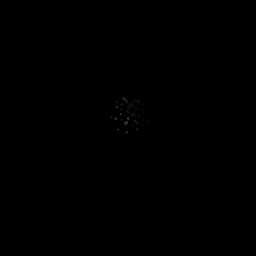
[im 39/39]
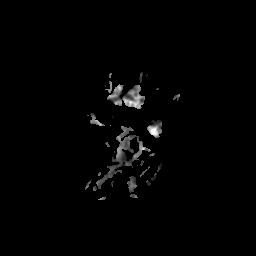

[25 of 48 positions shown; findings below may reference images not displayed]

FINDINGS: MRI HEAD FINDINGS

Brain: Multiple scattered T2/FLAIR hyperintense foci again seen
involving the supratentorial cerebral white matter. These are
positioned in a distribution most compatible with provided history
of multiple sclerosis. Overall, foci are relatively unchanged as
compared to previous MRI from 4343, with no definite new lesions
identified. Existing lesions or overall similar in size and
appearance. No abnormal restricted diffusion or postcontrast
enhancement to suggest active demyelination.

Cerebral volume within normal limits. No evidence for acute infarct.
No evidence for acute or chronic intracranial hemorrhage. No areas
of chronic infarction identified.

No mass lesion, midline shift, or mass effect. No hydrocephalus. No
extra-axial fluid collection. Major dural sinuses are grossly
patent. No abnormal enhancement.

Pituitary gland suprasellar region within normal limits.

Vascular: Major intracranial vascular flow voids are maintained.

Skull and upper cervical spine: Craniocervical junction normal. Bone
marrow signal intensity within normal limits. No scalp soft tissue
abnormality.

Sinuses/Orbits: Globes and orbital soft tissues within normal
limits. Paranasal sinuses are clear. No mastoid effusion. Inner ear
structures normal.

Other: No other significant finding.

MRI CERVICAL SPINE FINDINGS

Alignment: Vertebral bodies normally aligned with preservation of
the normal cervical lordosis. No listhesis.

Vertebrae: Vertebral body heights are maintained. No evidence for
acute or chronic fracture. Signal intensity within the vertebral
body bone marrow is normal. The no abnormal enhancement.

Cord: Patchy T2 signal abnormality within the left aspect of the
cord at the level of C2 and C3, stable from previous. Additional
small focus within the cord at the level of C4. No new lesions
identified. No abnormal enhancement to suggest active demyelination.
Overall cord volume within normal limits without significant cord
atrophy.

Posterior Fossa, vertebral arteries, paraspinal tissues: Paraspinous
soft tissues within normal limits. Normal intravascular flow voids
present within the vertebral arteries bilaterally. No abnormal
enhancement.

Disc levels:

C2-C3: Unremarkable.

C3-C4: Mild degenerative disc bulge with uncovertebral hypertrophy.
Resultant mild left foraminal stenosis. No canal or significant
right foraminal narrowing.

C4-C5: Mild diffuse disc bulge with bilateral uncovertebral
spurring. No significant canal or foraminal stenosis.

C5-C6: Diffuse degenerative disc bulge with mild intervertebral disc
space narrowing and uncovertebral spurring. Mild facet hypertrophy.
Resultant mild left foraminal stenosis. No significant right
foraminal narrowing. Central canal widely patent.

C6-C7: Mild diffuse disc bulge with bilateral uncovertebral
spurring. No significant stenosis.

C7-T1:  Unremarkable.

Visualized upper thoracic spine within normal limits.
IMPRESSION: MRI HEAD IMPRESSION:

1. Patchy cerebral white matter disease, consistent with history of
multiple sclerosis. Overall, findings are not significantly changed
relative to most recent MRI from 09/07/2008. No new lesions. No
evidence for active demyelination.
2. No other acute intracranial process identified.

MRI CERVICAL SPINE IMPRESSION:

1. Patchy cord signal abnormality within the upper cervical spinal
cord at the level of C2, C3, and C4, compatible with history of
multiple sclerosis. Overall, findings are not significantly changed
from previous. No new lesions. No evidence for active demyelination.
2. Mild multilevel degenerative spondylolysis as above without
significant stenosis.

## 2017-10-25 ENCOUNTER — Other Ambulatory Visit: Payer: Self-pay | Admitting: Nurse Practitioner

## 2017-10-25 DIAGNOSIS — M545 Low back pain, unspecified: Secondary | ICD-10-CM

## 2017-10-25 MED FILL — METAXALONE 800 MG TABLET: 800 | 33 days supply | Qty: 50 | Fill #0

## 2017-10-25 NOTE — Telephone Encounter (Signed)
Called patient and advised Skelaxin is not on his med list, last refilled 2017. He stated he takes it on as needed basis. He recently did some volunteer work and his back began to hurt. He stated he doesn't have anymore refills but would like the medication reordered. He stated he feels better now but doesn't have any Skelaxin remaining. This RN advised will send his request to [facility]. He verbalized understanding, appreciation.

## 2017-11-01 MED FILL — METHOTREXATE SODIUM 2.5 MG: 2.5 | 28 days supply | Qty: 24 | Fill #0

## 2017-11-05 DIAGNOSIS — L400 Psoriasis vulgaris: Secondary | ICD-10-CM | POA: Diagnosis not present

## 2017-11-05 DIAGNOSIS — Z79899 Other long term (current) drug therapy: Secondary | ICD-10-CM | POA: Diagnosis not present

## 2017-11-05 DIAGNOSIS — L401 Generalized pustular psoriasis: Secondary | ICD-10-CM | POA: Diagnosis not present

## 2017-11-09 MED FILL — FOLIC ACID 1 MG TABS: 1 | 28 days supply | Qty: 24 | Fill #2

## 2017-11-16 MED FILL — AVONEX PREFILLED SYR 30 MCG: 30 | 28 days supply | Qty: 1 | Fill #3

## 2017-11-28 MED FILL — METHOTREXATE SODIUM 2.5 MG: 2.5 | 28 days supply | Qty: 24 | Fill #1

## 2017-12-19 MED FILL — FOLIC ACID 1 MG TABS: 1 | 28 days supply | Qty: 24 | Fill #3

## 2017-12-20 MED FILL — AVONEX PREFILLED SYR 30 MCG: 30 | 28 days supply | Qty: 1 | Fill #4

## 2017-12-20 MED FILL — METHOTREXATE SODIUM 2.5 MG: 2.5 | 28 days supply | Qty: 24 | Fill #2

## 2018-01-18 MED FILL — METHOTREXATE SODIUM 2.5 MG: 2.5 | 28 days supply | Qty: 24 | Fill #3

## 2018-01-18 MED FILL — AVONEX PREFILLED SYR 30 MCG: 30 | 28 days supply | Qty: 1 | Fill #5

## 2018-01-18 MED FILL — FOLIC ACID 1 MG TABS: 1 | 28 days supply | Qty: 24 | Fill #4

## 2018-02-01 ENCOUNTER — Encounter: Payer: Self-pay | Admitting: Dietician

## 2018-02-01 ENCOUNTER — Encounter: Payer: 59 | Attending: Neurology | Admitting: Dietician

## 2018-02-01 DIAGNOSIS — Z713 Dietary counseling and surveillance: Secondary | ICD-10-CM

## 2018-02-01 NOTE — Progress Notes (Signed)
  Medical Nutrition Therapy:  Appt start time: 1400 end time:  1430.   Assessment:  Primary concerns today: Patient is here today alone.  History includes MS (mostly affecting left side) for the past 11 years.  He is here today due to weight concerns.  He has seen his brother-in-law yo yo diet and is not interested.   Weight today 224 lbs. 186 lbs 12 years ago Current weight plateaud.  Patient lives with his wife who is a Publishing rights manager at the cancer center.  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  His wife cooks and shops. 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[employer])   Preferred Learning Style:   No preference indicated   Learning Readiness:   Contemplating  MEDICATIONS: see list to include folic acid   DIETARY INTAKE: Avoided foods include dislikes vegetables, plain water  Prefers meat and potatoes, hamburgers, steaks, chicken  24-hr recall:  B ( AM): fruit  Snk ( AM): banana, Carnation Instant Breakfast L ( PM): subway with chips OR Japanese fried fries OR [place] club with potato salad or Pizza Snk ( PM): none D ( PM): [employer] OR Cook Out OR spaghetti OR skinless chicken breast, rice OR subs Snk ( PM): occasional cereal and skim milk Beverages: Sweet tea, 1/2 Mt. Dew, Smart water, Coffee with flavored cream and sugar, Carnation Instant Breakfast  Usual physical activity: treadmill 4 days a week for 30 minutes; rebounds or other things with his children  Estimated energy needs: 1800-2000 calories 130 g protein 60 g fat  Progress Towards Goal(s):  In progress.   Nutritional Diagnosis:  [place]-3.3 Overweight/obesity As related to MS, food choices.  As evidenced by BMI 30.    Intervention:  Nutrition Education/counseling related to mindfulness, healthy eating.  He is not willing to increase vegetables, omit sweetened beverages at this time.  He states that he does poorly at caring for himself.  Discussed importance of self care.  Consider:  Be informed about what you are  eating when eating out.  -mindful choices   Calorie King app  Medium tea rather than large tea etc.  Eat slowly and stop when you are satisfied.  Mindfulness  Continued walking.  Aim for 30 minutes most days.   Adequate sleep (aim for 7 hours per night)  Teaching Method Utilized:  Visual Auditory  Handouts given during visit include:  Healthier food choices when eating out  Barriers to learning/adherence to lifestyle change: MS, taste preference, readiness to change  Demonstrated degree of understanding via:  Teach Back   Monitoring/Evaluation:  Dietary intake, exercise, label reading, and body weight in 1 week(s).

## 2018-02-01 NOTE — Patient Instructions (Addendum)
Consider:  Be informed about what you are eating when eating out.  -mindful choices   Calorie King app  Medium tea rather than large tea etc.  Eat slowly and stop when you are satisfied.  Mindfulness  Continued walking.  Aim for 30 minutes most days.   Adequate sleep (aim for 7 hours per night)

## 2018-02-07 ENCOUNTER — Encounter: Payer: Self-pay | Admitting: Dietician

## 2018-02-07 ENCOUNTER — Encounter: Payer: 59 | Admitting: Dietician

## 2018-02-07 DIAGNOSIS — Z713 Dietary counseling and surveillance: Secondary | ICD-10-CM

## 2018-02-07 NOTE — Progress Notes (Signed)
  Medical Nutrition Therapy:  Appt start time: 0805 end time:  0830.  Assessment:  Primary concerns today: Patient is here today alone for follow up of nutrition visit last week.  Since last visit he has been more mindful of his choices (looking up the nutritional information of foods eaten out), drank 1/2 and 1/2 tea rather than all sweet, and drinking more water.   Sleep remains a challenge related to him MS and job.  He strongly dislikes most vegetables and is unwilling to change this.  Patient lives with his wife who is a Publishing rights manager at the cancer center.  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  His wife cooks and shops.  (Ex military 10 years in the [employer]).   MEDICATIONS: see list to include folic acide  Recent physical activity: treadmill in his basement (4 days per week for 30 minutes  Estimated energy needs: 1800-2000 calories 130 g carbohydrates 60 g fat  Progress Towards Goal(s):  In progress.   Nutritional Diagnosis:  -3.3 Overweight/obesity As related to MS, food choices.  As evidenced by BMI 30.    Intervention:  Nutrition counseling/education continued related to mindfulness, healthy eating.  Encouraged exercise as able.    Continue:             Being informed about what you are eating when eating out.  -mindful choices                         Calorie Brooke Dare app             Drinking more water (Half and Half tea)             Eat slowly and stop when you are satisfied.   Portion control             Mindfulness             Continued walking.  Aim for 30 minutes most days.               Monitoring/Evaluation:  Dietary intake, exercise, and body weight in 1 week(s).

## 2018-02-07 NOTE — Patient Instructions (Signed)
Continue:             Being informed about what you are eating when eating out.  -mindful choices                         Calorie Brooke Dare app             Drinking more water (Half and Half tea)             Eat slowly and stop when you are satisfied.   Portion control             Mindfulness             Continued walking.  Aim for 30 minutes most days.

## 2018-02-14 ENCOUNTER — Encounter: Payer: 59 | Admitting: Dietician

## 2018-02-14 DIAGNOSIS — Z713 Dietary counseling and surveillance: Secondary | ICD-10-CM

## 2018-02-14 NOTE — Patient Instructions (Signed)
Add the Treadmill back Continue mindfulness  Choices  Eating slowly Going to bed when you are tired

## 2018-02-14 NOTE — Progress Notes (Signed)
  Medical Nutrition Therapy:  Appt start time: 0810 end time:  0840.  Assessment:  Primary concerns today: Patient is here today alone in his 3rd wellness visit.   Weight today 223 lbs.   He has continued to be mindful and stop eating when satisfied.  He is now letting a typical restaurant meal last 2-3 meals.  He is also continuing to drink less sweet tea and more water.  He states that the next change that he feels that he should make is healthier choices.  Sleep remains a challenge related to MS, work but also personality/choices.  He has not started on the treadmill again but this is a goal.  Patient lives with his wife and 32 and 50 yo children. His wife is a Publishing rights manager at the cancer center.  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  (Ex military 10 years in the [employer])  MEDICATIONS: see list to include folic acid  Recent physical activity: treadmill in his basement (4 days per week for 30 minutes) but not recently  Estimated energy needs: 1800-2000 calories 130 g protein 60 g fat  Progress Towards Goal(s):  In progress.   Nutritional Diagnosis:  [place]-3.3 Overweight/obesity As related to MS, food choices.  As evidenced by BMI 30.    Intervention:  Nutrition counseling/education continued related to mindfulness, healthy eating.  Encouraged exercise as able. Discussed sleep more as well with things that are barriers.  Monitoring/Evaluation:  Dietary intake, exercise, and body weight prn.

## 2018-02-18 DIAGNOSIS — L400 Psoriasis vulgaris: Secondary | ICD-10-CM | POA: Diagnosis not present

## 2018-02-18 DIAGNOSIS — Z79899 Other long term (current) drug therapy: Secondary | ICD-10-CM | POA: Diagnosis not present

## 2018-02-22 MED FILL — FOLIC ACID 1 MG TABS: 1 | 28 days supply | Qty: 24 | Fill #5

## 2018-02-23 MED FILL — METHOTREXATE SODIUM 2.5 MG: 2.5 | 28 days supply | Qty: 16 | Fill #0

## 2018-02-28 MED FILL — AVONEX PREFILLED SYR 30 MCG: 30 | 28 days supply | Qty: 1 | Fill #6

## 2018-03-21 MED FILL — METHOTREXATE SODIUM 2.5 MG: 2.5 | 28 days supply | Qty: 16 | Fill #1

## 2018-04-03 MED FILL — AVONEX PREFILLED SYR 30 MCG: 30 | 28 days supply | Qty: 1 | Fill #7

## 2018-04-18 MED FILL — METHOTREXATE SODIUM 2.5 MG: 2.5 | 28 days supply | Qty: 16 | Fill #2

## 2018-04-24 MED FILL — FOLIC ACID 1 MG TABS: 1 | 28 days supply | Qty: 24 | Fill #6

## 2018-05-16 MED FILL — METHOTREXATE SODIUM 2.5 MG: 2.5 | 28 days supply | Qty: 16 | Fill #3

## 2018-05-16 MED FILL — AVONEX PREFILLED SYR 30 MCG: 30 | 28 days supply | Qty: 1 | Fill #8

## 2018-05-25 DIAGNOSIS — L400 Psoriasis vulgaris: Secondary | ICD-10-CM | POA: Diagnosis not present

## 2018-05-25 DIAGNOSIS — Z79899 Other long term (current) drug therapy: Secondary | ICD-10-CM | POA: Diagnosis not present

## 2018-06-18 MED FILL — AVONEX PREFILLED SYR 30 MCG: 30 | 28 days supply | Qty: 1 | Fill #9

## 2018-06-27 ENCOUNTER — Telehealth: Payer: Self-pay | Admitting: *Deleted

## 2018-06-27 NOTE — Telephone Encounter (Addendum)
Avonex approved , max 12 refills, 1 kit of 4 syringes per 28 days, approved from 07/09/17 to 07/09/19.

## 2018-06-27 NOTE — Telephone Encounter (Signed)
Avonex 30 mcg/0.5 mL PA started on CMM, key [id number]. Diagnosed with MS in 2007 and on Avonex since then. MRIs stable, tolerating medication well.  CMM information sent to [facility] for review.

## 2018-07-01 ENCOUNTER — Encounter: Payer: Self-pay | Admitting: Physician Assistant

## 2018-07-01 ENCOUNTER — Ambulatory Visit (INDEPENDENT_AMBULATORY_CARE_PROVIDER_SITE_OTHER): Payer: 59

## 2018-07-01 DIAGNOSIS — Z23 Encounter for immunization: Secondary | ICD-10-CM

## 2018-07-05 NOTE — Progress Notes (Signed)
[facility]  PATIENT: Dennis Leonard DOB: 09-21-1965   REASON FOR VISIT: For multiple sclerosis, relapsing remitting HISTORY FROM: Patient    HISTORY OF PRESENT ILLNESS:UPDATE 1/15/2020[name], 53 year old male returns for follow-up with history of relapsing remitting multiple sclerosis.  He is currently on Avonex and is tolerated the medication well with no injection site issues.  He has been stable without exacerbation of symptoms.  Last MRI of the brain in 2018 without change from 2010.  MRI of the cervical spine with mild multilevel degenerative disc changes no significant stenosis.  Patient works as a Education administrator.  He denies any new sensory changes weakness balance issues visual changes.  He has not had problems with bowel or bladder he exercises on the treadmill daily.  No interval medical issues.  He returns for reevaluation   UPDATE 1/14/2019CM  Dennis Leonard, 53 year old male returns for follow-up with history of relapsing remitting multiple sclerosis.  He has been on Avonex for a number of years and is tolerated medication well.  He denies any injection site issues has not had any exacerbation of symptoms in quite some time.  MRI of the brain 02/07/2017 without change from 2010.  MRI of the cervical spine with mild multilevel degenerative no significant stenosis.  Patient denies any visual changes weakness balance changes no new numbness.  He has not had any problems with bowel or bladder control.  He uses a treadmill intermittently for exercise.  09/09/15 CMMr. Leonard is a 53 year old right-handed white male with a history of multiple sclerosis. The patient has been on Avonex for number of years, and he has tolerated the medication well. He has not had any clinical MS attacks in at last 6 years. The last MRI of the brain and cervical spinal cord was done in 2010.  The patient does have a history of low back pain as well, but he indicates that he has started to work out on  a regular basis, and his back discomfort has improved over time. He has Skelaxin at home to take if he needs it. The patient again reports no new numbness, weakness, balance changes, bowel or bladder control issues, or visual changes. He returns for an evaluation. He uses his treadmill every day and he occasionally will note some clumsiness with his left leg with ambulation. He returns for reevaluation    REVIEW OF SYSTEMS: Full 14 system review of systems performed and notable only for those listed, all others are neg:  Constitutional: neg  Cardiovascular: neg Ear/Nose/Throat: neg  Skin: neg Eyes: neg Respiratory: neg Gastroitestinal: neg  Hematology/Lymphatic: neg  Endocrine: neg Musculoskeletal:neg Allergy/Immunology: neg Neurological: neg Psychiatric: neg Sleep : neg   ALLERGIES: No Known Allergies  HOME MEDICATIONS: Outpatient Medications Prior to Visit  Medication Sig Dispense Refill  . folic acid (FOLVITE) 1 MG tablet Take 1 mg by mouth daily.    Marland Kitchen ibuprofen (ADVIL,MOTRIN) 200 MG tablet Take 2-3 tablets (400-600 mg total) by mouth every 6 (six) hours as needed for pain.    Marland Kitchen interferon beta-1a (AVONEX PREFILLED) 30 MCG/0.5ML PSKT injection INJECT 0.5 MLS INTO THE MUSCLE EVERY 7 DAYS. 4 each 11  . metaxalone (SKELAXIN) 800 MG tablet TAKE 1/2 TABLET BY MOUTH 3 TIMES DAILY AS NEEDED. 50 tablet 3   No facility-administered medications prior to visit.     PAST MEDICAL HISTORY: Past Medical History:  Diagnosis Date  . History of chickenpox   . Low back pain   . Multiple sclerosis, relapsing-remitting (  HCC) 07/23/2006 dx    PAST SURGICAL HISTORY: Past Surgical History:  Procedure Laterality Date  . APPENDECTOMY  1994  . TONSILLECTOMY  1974    FAMILY HISTORY: Family History  Problem Relation Age of Onset  . Breast cancer Mother 9  . Hyperlipidemia Mother   . Alcohol abuse Maternal Uncle   . Lung cancer Maternal Aunt 75  . Birth defects Sister     SOCIAL  HISTORY: Social History   Socioeconomic History  . Marital status: Married    Spouse name: Not on file  . Number of children: 2  . Years of education: COLLEGE  . Highest education level: Not on file  Occupational History  . Occupation: JUDGE  Social Needs  . Financial resource strain: Not on file  . Food insecurity:    Worry: Not on file    Inability: Not on file  . Transportation needs:    Medical: Not on file    Non-medical: Not on file  Tobacco Use  . Smoking status: Former Smoker    Types: Cigarettes    Last attempt to quit: 06/22/2004    Years since quitting: 14.0  . Smokeless tobacco: Never Used  . Tobacco comment: JD, criminal Therapist, occupational. Married, lives with wife and 2 sons  Substance and Sexual Activity  . Alcohol use: No  . Drug use: No  . Sexual activity: Yes  Lifestyle  . Physical activity:    Days per week: Not on file    Minutes per session: Not on file  . Stress: Not on file  Relationships  . Social connections:    Talks on phone: Not on file    Gets together: Not on file    Attends religious service: Not on file    Active member of club or organization: Not on file    Attends meetings of clubs or organizations: Not on file    Relationship status: Not on file  . Intimate partner violence:    Fear of current or ex partner: Not on file    Emotionally abused: Not on file    Physically abused: Not on file    Forced sexual activity: Not on file  Other Topics Concern  . Not on file  Social History Narrative   Patient drinks 1-2 cups of caffeine daily.   Patient is right handed.     PHYSICAL EXAM  Vitals:   07/06/18 1521  BP: 130/86  Pulse: 77  Weight: 224 lb 12.8 oz (102 kg)   Body mass index is 30.49 kg/m.  Generalized: Well developed, in no acute distress  Head: normocephalic and atraumatic,. Oropharynx benign  Neck: Supple,  Musculoskeletal: No deformity   Neurological examination   Mentation: Alert oriented to time, place, history  taking. Attention span and concentration appropriate. Recent and remote memory intact.  Follows all commands speech and language fluent.   Cranial nerve II-XII: Pupils were equal round reactive to light extraocular movements were full, visual field were full on confrontational test. Facial sensation and strength were normal. hearing was intact to finger rubbing bilaterally. Uvula tongue midline. Head turning and shoulder shrug were normal and symmetric.Tongue protrusion into cheek strength was normal. Motor: normal bulk and tone, full strength in the BUE, BLE, fine finger movements normal, no pronator drift. No focal weakness Sensory: normal and symmetric to light touch, pinprick, and  Vibration, in the upper and lower extremities Coordination: finger-nose-finger, heel-to-shin bilaterally, no dysmetria Reflexes: Symmetric upper and lower plantar responses were flexor bilaterally. Gait  and Station: Rising up from seated position without assistance, normal stance,  moderate stride, good arm swing, smooth turning, able to perform tiptoe, and heel walking without difficulty. Tandem gait is steady  DIAGNOSTIC DATA (LABS, IMAGING, TESTING) - ASSESSMENT AND PLAN  53 y.o. year old male  has a past medical history of History of chickenpox, Low back pain, and Multiple sclerosis, relapsing-remitting (HCC) (07/23/2006 dx). here to follow-up. No exacerbation of MS symptoms in  8 years.  MRI of the brain in MRI cervical spine stable March 2018  Continue Avonex weekly will renew Labs today, CBC and CMP to monitor for adverse effects of Avonex Follow-up yearly Call for exacerbation of symptoms Nilda RiggsNancy Carolyn Martin, [facility] Medical CenterGNP, [facility] NorthBC, APRN  [facility] [address], [address] [place], [place] [address] [phone]

## 2018-07-06 ENCOUNTER — Ambulatory Visit: Payer: 59 | Admitting: Nurse Practitioner

## 2018-07-06 ENCOUNTER — Encounter: Payer: Self-pay | Admitting: Nurse Practitioner

## 2018-07-06 VITALS — BP 130/86 | HR 77 | Wt 224.8 lb

## 2018-07-06 DIAGNOSIS — G35 Multiple sclerosis: Secondary | ICD-10-CM

## 2018-07-06 DIAGNOSIS — Z5181 Encounter for therapeutic drug level monitoring: Secondary | ICD-10-CM

## 2018-07-06 MED ORDER — INTERFERON BETA-1A 30 MCG/0.5ML IM PSKT: PREFILLED_SYRINGE | INTRAMUSCULAR | 11 refills | Status: DC

## 2018-07-06 NOTE — Patient Instructions (Signed)
Continue Avonex weekly will renew Labs today, CBC and CMP Follow-up yearly Call for exacerbation of symptoms

## 2018-07-06 NOTE — Progress Notes (Signed)
I have read the note, and I agree with the clinical assessment and plan.  Kannon Granderson K Jaren Vanetten   

## 2018-07-07 ENCOUNTER — Encounter: Payer: Self-pay | Admitting: *Deleted

## 2018-07-07 ENCOUNTER — Other Ambulatory Visit: Payer: Self-pay | Admitting: Pharmacist

## 2018-07-07 LAB — CBC WITH DIFFERENTIAL/PLATELET
BASOS ABS: 0 10*3/uL (ref 0.0–0.2)
Basos: 0 %
EOS (ABSOLUTE): 0.1 10*3/uL (ref 0.0–0.4)
Eos: 1 %
Hematocrit: 46.5 % (ref 37.5–51.0)
Hemoglobin: 15.7 g/dL (ref 13.0–17.7)
IMMATURE GRANS (ABS): 0 10*3/uL (ref 0.0–0.1)
Immature Granulocytes: 1 %
LYMPHS ABS: 1.7 10*3/uL (ref 0.7–3.1)
Lymphs: 31 %
MCH: 32.3 pg (ref 26.6–33.0)
MCHC: 33.8 g/dL (ref 31.5–35.7)
MCV: 96 fL (ref 79–97)
MONOS ABS: 0.6 10*3/uL (ref 0.1–0.9)
Monocytes: 10 %
Neutrophils Absolute: 3.2 10*3/uL (ref 1.4–7.0)
Neutrophils: 57 %
PLATELETS: 328 10*3/uL (ref 150–450)
RBC: 4.86 x10E6/uL (ref 4.14–5.80)
RDW: 13 % (ref 11.6–15.4)
WBC: 5.6 10*3/uL (ref 3.4–10.8)

## 2018-07-07 LAB — COMPREHENSIVE METABOLIC PANEL
ALBUMIN: 4.5 g/dL (ref 3.5–5.5)
ALK PHOS: 49 IU/L (ref 39–117)
ALT: 44 IU/L (ref 0–44)
AST: 26 IU/L (ref 0–40)
Albumin/Globulin Ratio: 1.7 (ref 1.2–2.2)
BILIRUBIN TOTAL: 0.4 mg/dL (ref 0.0–1.2)
BUN / CREAT RATIO: 12 (ref 9–20)
BUN: 14 mg/dL (ref 6–24)
CHLORIDE: 103 mmol/L (ref 96–106)
CO2: 24 mmol/L (ref 20–29)
Calcium: 9.3 mg/dL (ref 8.7–10.2)
Creatinine, Ser: 1.14 mg/dL (ref 0.76–1.27)
GFR calc Af Amer: 86 mL/min/{1.73_m2} (ref >59)
GFR calc non Af Amer: 75 mL/min/{1.73_m2} (ref >59)
Globulin, Total: 2.7 g/dL (ref 1.5–4.5)
Glucose: 79 mg/dL (ref 65–99)
POTASSIUM: 4.7 mmol/L (ref 3.5–5.2)
SODIUM: 143 mmol/L (ref 134–144)
Total Protein: 7.2 g/dL (ref 6.0–8.5)

## 2018-07-07 MED ORDER — INTERFERON BETA-1A 30 MCG/0.5ML IM PSKT: PREFILLED_SYRINGE | INTRAMUSCULAR | 11 refills | Status: DC

## 2018-08-01 MED FILL — AVONEX PREFILLED SYR 30 MCG: 30 | 28 days supply | Qty: 1 | Fill #0

## 2018-08-05 MED FILL — METHOTREXATE SODIUM 2.5 MG: 2.5 | 28 days supply | Qty: 16 | Fill #0

## 2018-08-08 MED FILL — FOLIC ACID 1 MG TABS: 1 | 30 days supply | Qty: 26 | Fill #0

## 2018-08-30 MED FILL — METHOTREXATE SODIUM 2.5 MG: 2.5 | 28 days supply | Qty: 16 | Fill #1

## 2018-09-05 MED FILL — AVONEX PREFILLED SYR 30 MCG: 30 | 28 days supply | Qty: 1 | Fill #1

## 2018-09-14 MED FILL — FOLIC ACID 1 MG TABS: 1 | 30 days supply | Qty: 26 | Fill #1

## 2018-09-22 MED FILL — METHOTREXATE SODIUM 2.5 MG: 2.5 | 28 days supply | Qty: 16 | Fill #2

## 2018-09-23 ENCOUNTER — Other Ambulatory Visit: Payer: Self-pay

## 2018-09-23 ENCOUNTER — Ambulatory Visit (INDEPENDENT_AMBULATORY_CARE_PROVIDER_SITE_OTHER): Payer: 59 | Admitting: Pharmacist

## 2018-09-23 ENCOUNTER — Encounter: Payer: Self-pay | Admitting: Pharmacist

## 2018-09-23 DIAGNOSIS — Z79899 Other long term (current) drug therapy: Secondary | ICD-10-CM

## 2018-09-23 NOTE — Progress Notes (Signed)
S: Patient is currently taking Avonex for multiple sclerosis. Patient is managed by Dr. Anne Hahn for this.   Adherence: denies any missed doses   Efficacy - per last neuro note, everything is stable.   Current adverse effects:  Labs: WNL (including LFTs) Flu-like symptoms: denies any recent sx Neuropsychiatric side effects: denies   O:     Lab Results  Component Value Date   WBC 5.6 07/06/2018   HGB 15.7 07/06/2018   HCT 46.5 07/06/2018   MCV 96 07/06/2018   PLT 328 07/06/2018      Chemistry      Component Value Date/Time   NA 143 07/06/2018 1556   K 4.7 07/06/2018 1556   CL 103 07/06/2018 1556   CO2 24 07/06/2018 1556   BUN 14 07/06/2018 1556   CREATININE 1.14 07/06/2018 1556      Component Value Date/Time   CALCIUM 9.3 07/06/2018 1556   ALKPHOS 49 07/06/2018 1556   AST 26 07/06/2018 1556   ALT 44 07/06/2018 1556   BILITOT 0.4 07/06/2018 1556       A/P: 1. Medication review: patient continues to tolerate and be stable on Avonex for MS with no adverse effects. Disease is stable per neuro.  Reviewed Avonex, including injection technique and adverse effects. No recommendations for any changes at this time.   Alvino Blood, PharmD, BCPS, BCACP, CPP Clinical Pharmacist Practitioner  [phone]

## 2018-10-08 MED FILL — AVONEX PREFILLED SYR 30 MCG: 30 | 28 days supply | Qty: 1 | Fill #2

## 2018-10-21 MED FILL — METHOTREXATE SODIUM 2.5 MG: 2.5 | 28 days supply | Qty: 16 | Fill #0

## 2018-11-15 MED FILL — AVONEX PREFILLED SYR 30 MCG: 30 | 28 days supply | Qty: 1 | Fill #3

## 2018-11-23 MED FILL — METHOTREXATE SODIUM 2.5 MG: 2.5 | 28 days supply | Qty: 16 | Fill #1

## 2018-11-23 MED FILL — FOLIC ACID 1 MG TABS: 1 | 30 days supply | Qty: 26 | Fill #2

## 2018-12-12 MED FILL — AVONEX PREFILLED SYR 30 MCG: 30 | 28 days supply | Qty: 1 | Fill #4

## 2018-12-13 ENCOUNTER — Encounter: Payer: 59 | Attending: Physician Assistant | Admitting: Dietician

## 2018-12-13 ENCOUNTER — Encounter: Payer: Self-pay | Admitting: Dietician

## 2018-12-13 ENCOUNTER — Other Ambulatory Visit: Payer: Self-pay

## 2018-12-13 DIAGNOSIS — Z713 Dietary counseling and surveillance: Secondary | ICD-10-CM | POA: Insufficient documentation

## 2018-12-13 NOTE — Progress Notes (Signed)
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 Ex-military, served ~10 years. Pt lives with his wife and their 2 boys, 74 and 36 years old.    24-Hr Dietary Recall First Meal: Poptarts  Snack: Welch's fruit snacks  Second Meal: Slim Fast  Snack: none stated  Third Meal: pizza  Snack: none stated   Food & Nutrition Related Hx Dietary Hx: Not a "sweets person." Likes pizza, burgers, fries, pasta. Dislikes vegetables, and states he knows that will never change. States he is getting better with portion sizes, such as only eating 2 slices of pizza at a time vs 1/2 a pizza. Wife and kids do well at eating "healthy foods." Often will be "starving" between meals, especially after lunch if he only has a Slim Fast shake. States he does well with incremental, small changes.   Physical Activity  Current average weekly physical activity: treadmill, playing with the kids    Estimated Energy Needs Calories: 2200 Carbohydrate: 248g Protein: 138g Fat: 73g   NUTRITION DIAGNOSIS  Inadequate fiber intake (NI-5.8.5) related to nutrition-related knowledge deficit concerning appropriate fiber intake as evidenced by dietary recall reflecting low fiber intake which does not meet estimated daily needs.    NUTRITION INTERVENTION  Nutrition education (E-1) on the following topics:  . Dietary fiber: goal/day, sources, role, benefits  . Balanced snacks: importance of carbs and protein, appropriate choices  Handouts Provided Include   Balanced Snacks    Learning Style & Readiness for Change Teaching method utilized: Visual & Auditory  Demonstrated degree of understanding via: Teach Back  Barriers to learning/adherence to lifestyle change: MS, taste preferences, contemplative stage of change   Goals Established by Pt  Look at the Nutrition Facts label on foods/snacks currently eating. If it does not have at least 3 grams of fiber, try to find an alternative that does.   MONITORING & EVALUATION Dietary intake, weekly physical activity, and goal in 3-4 weeks.  RD's Notes for Next Visit  . Water/ fluid intake: goals, why this is important (especially when fiber intake increases), relation to weight maintenance goals  Next Steps  Patient is to return to [facility] for 2nd wellness visit in 3 to 4 weeks.

## 2018-12-13 NOTE — Patient Instructions (Addendum)
Goal to work on:  - Look at the Nutrition Facts label on foods/snacks currently eating. If it does not have at least 3 grams of fiber, try to find an alternative that does. If it has 5 or more grams, it is a great source!

## 2018-12-16 DIAGNOSIS — L218 Other seborrheic dermatitis: Secondary | ICD-10-CM | POA: Diagnosis not present

## 2018-12-16 DIAGNOSIS — L400 Psoriasis vulgaris: Secondary | ICD-10-CM | POA: Diagnosis not present

## 2019-01-11 ENCOUNTER — Encounter: Payer: 59 | Attending: Physician Assistant | Admitting: Dietician

## 2019-01-11 ENCOUNTER — Encounter: Payer: Self-pay | Admitting: Dietician

## 2019-01-11 ENCOUNTER — Other Ambulatory Visit: Payer: Self-pay

## 2019-01-11 DIAGNOSIS — Z713 Dietary counseling and surveillance: Secondary | ICD-10-CM | POA: Insufficient documentation

## 2019-01-11 NOTE — Patient Instructions (Addendum)
Goals:   Aim to continue being physically active by walking on the treadmill daily.   Aim to drink more water, starting by keeping some water with you during workouts.

## 2019-01-11 NOTE — Progress Notes (Addendum)
Medical Nutrition Therapy  Appt Start Time: 7:40am  End Time: 8:10am  [facility] Wellness Visit 2 of 3  Primary concerns today: weight management, MS   Preferred learning style: no preference indicated Learning readiness: contemplating   NUTRITION ASSESSMENT   Anthropometrics  Weight: 217.9 lbs  (-2.1 lbs since previous visit 12/13/2018)  Body Composition Scale  01/11/2019  Body Weight 215.3  Total Body Fat % 23.7  Visceral Fat 15  Fat-Free Mass % 76.2   Total Body Water % 57.2   Muscle-Mass lbs 40.5  BMI 29.2    Clinical Medical Hx: MS Surgeries: appendectomy  Medications: avonex   Psychosocial/Lifestyle 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 Ex-military, served ~10 years. Pt lives with his wife and their 2 boys, 86 and 51 years old.    Food & Nutrition Related Hx Dietary Hx: States he does well with incremental, small changes.  States he is not eating as much at a time, but rather stops eating when he is full. He and his wife now split meals, whereas before he would eat the whole meal by himself. No longer eating Poptarts for breakfast every morning, but will have apples instead. Paying attention to Nutrition Facts Labels, focusing on calories.  Fluids do not include water, but include coffee, sweet tea, Mountain Dew, and Gatorade.   Physical Activity  Current average weekly physical activity: walking on the treadmill daily, lifting weights at home (weight bench), playing basketball with the kids    Estimated Energy Needs Calories: 2200 Carbohydrate: 248g Protein: 138g Fat: 73g  Body Composition Scale Readings Activity Level Calories/Day  BMR 1858  Very Light 2230  Light 2602  Moderate 2787  Heavy 3159  Very Heavy 3531    NUTRITION INTERVENTION  Nutrition education (E-1) on the following topics:  . Body composition, healthy rate of weight loss  . Focusing on foods as a whole for providing  nutrition, not ruling out foods based on their calories, pay attention to certain nutrients on the Nutrition Facts Label (ex: limit saturated fat and added sugar, get plenty of fiber and protein)  . Tips for increasing fluid/water intake (ex: try low/no sugar added beverages such as Gatorade Zero, keep a refillable bottle with you, set timers)   Handouts Provided Include   Body Composition Scale    Learning Style & Readiness for Change Teaching method utilized: Visual & Auditory  Demonstrated degree of understanding via: Teach Back  Barriers to learning/adherence to lifestyle change: MS, taste preferences, contemplative stage of change   Goals Established by Pt  Look at the Nutrition Facts label on foods/snacks currently eating. If it does not have at least 3 grams of fiber, try to find an alternative that does.  NEW: Aim to continue being physically active by walking on the treadmill daily.  NEW: Aim to drink more water, starting by keeping some water with you during workouts.  Patient states his overall goal it to help manage his MS, and verbalized that he notices positive changes day to day when he maintains physical activity (walking on the treadmill every night) and that this is important to him. Additionally, he was happy to see a loss of 2-3 lbs, stating he feared his weight went up despite his efforts in being more physically active (inclusing lifting weights) and making dietary changes (not eating Poptarts for breakfast every morning.) States seeing weight loss is a goal that is secondary to  managing his MS.  Patient states that he likes goals and does well when he sees progress. Verbalized that he liked seeing the Body Composition Scale reading and looks forward to using it as a baseline to compare changes over time. States he would like to move forward by prioritizing physical activity as his main goal, followed by increasing fluid (especially water) intake. Later, pt states he would  like to make small changes to diet as well.     MONITORING & EVALUATION Dietary intake, weekly physical activity, and goal in 3 weeks.  RD's Notes for Next Visit  . Meal ideas, or tweaks to breakfast/lunch/dinners (make SMALL changes at a time)   Next Steps  Patient is to return to [facility] for 3rd wellness visit in 3 weeks.

## 2019-01-13 ENCOUNTER — Ambulatory Visit (INDEPENDENT_AMBULATORY_CARE_PROVIDER_SITE_OTHER): Payer: 59 | Admitting: Physician Assistant

## 2019-01-13 ENCOUNTER — Other Ambulatory Visit: Payer: Self-pay

## 2019-01-13 ENCOUNTER — Encounter: Payer: 59 | Admitting: Physician Assistant

## 2019-01-13 ENCOUNTER — Encounter: Payer: Self-pay | Admitting: Physician Assistant

## 2019-01-13 VITALS — BP 118/70 | HR 71 | Temp 98.3°F | Resp 16 | Ht 72.5 in | Wt 218.0 lb

## 2019-01-13 DIAGNOSIS — Z125 Encounter for screening for malignant neoplasm of prostate: Secondary | ICD-10-CM | POA: Diagnosis not present

## 2019-01-13 DIAGNOSIS — Z0000 Encounter for general adult medical examination without abnormal findings: Secondary | ICD-10-CM | POA: Diagnosis not present

## 2019-01-13 DIAGNOSIS — Z1211 Encounter for screening for malignant neoplasm of colon: Secondary | ICD-10-CM

## 2019-01-13 LAB — COMPREHENSIVE METABOLIC PANEL
ALT: 27 U/L (ref 0–53)
AST: 18 U/L (ref 0–37)
Albumin: 4.4 g/dL (ref 3.5–5.2)
Alkaline Phosphatase: 42 U/L (ref 39–117)
BUN: 15 mg/dL (ref 6–23)
CO2: 29 mEq/L (ref 19–32)
Calcium: 9.9 mg/dL (ref 8.4–10.5)
Chloride: 107 mEq/L (ref 96–112)
Creatinine, Ser: 1.04 mg/dL (ref 0.40–1.50)
GFR: 75.27 mL/min (ref >60.00)
Glucose, Bld: 94 mg/dL (ref 70–99)
Potassium: 4.9 mEq/L (ref 3.5–5.1)
Sodium: 141 mEq/L (ref 135–145)
Total Bilirubin: 0.7 mg/dL (ref 0.2–1.2)
Total Protein: 6.9 g/dL (ref 6.0–8.3)

## 2019-01-13 LAB — CBC WITH DIFFERENTIAL/PLATELET
Basophils Absolute: 0 10*3/uL (ref 0.0–0.1)
Basophils Relative: 0.5 % (ref 0.0–3.0)
Eosinophils Absolute: 0.1 10*3/uL (ref 0.0–0.7)
Eosinophils Relative: 1.6 % (ref 0.0–5.0)
HCT: 46.4 % (ref 39.0–52.0)
Hemoglobin: 15.8 g/dL (ref 13.0–17.0)
Lymphocytes Relative: 28.4 % (ref 12.0–46.0)
Lymphs Abs: 1.5 10*3/uL (ref 0.7–4.0)
MCHC: 34.1 g/dL (ref 30.0–36.0)
MCV: 97.7 fl (ref 78.0–100.0)
Monocytes Absolute: 0.4 10*3/uL (ref 0.1–1.0)
Monocytes Relative: 8.4 % (ref 3.0–12.0)
Neutro Abs: 3.2 10*3/uL (ref 1.4–7.7)
Neutrophils Relative %: 61.1 % (ref 43.0–77.0)
Platelets: 329 10*3/uL (ref 150.0–400.0)
RBC: 4.75 Mil/uL (ref 4.22–5.81)
RDW: 13.1 % (ref 11.5–15.5)
WBC: 5.2 10*3/uL (ref 4.0–10.5)

## 2019-01-13 LAB — LDL CHOLESTEROL, DIRECT: Direct LDL: 81 mg/dL

## 2019-01-13 LAB — LIPID PANEL
Cholesterol: 161 mg/dL (ref 0–200)
HDL: 36 mg/dL — ABNORMAL LOW (ref >39.00)
NonHDL: 125.33
Total CHOL/HDL Ratio: 4
Triglycerides: 344 mg/dL — ABNORMAL HIGH (ref 0.0–149.0)
VLDL: 68.8 mg/dL — ABNORMAL HIGH (ref 0.0–40.0)

## 2019-01-13 LAB — PSA: PSA: 0.69 ng/mL (ref 0.10–4.00)

## 2019-01-13 LAB — HEMOGLOBIN A1C: Hgb A1c MFr Bld: 5.1 % (ref 4.6–6.5)

## 2019-01-13 NOTE — Patient Instructions (Signed)
Please go to the lab for blood work.   Our office will call you with your results unless you have chosen to receive results via MyChart.  If your blood work is normal we will follow-up each year for physicals and as scheduled for chronic medical problems.  If anything is abnormal we will treat accordingly and get you in for a follow-up.   Preventive Care 40-51 Years Old, Male Preventive care refers to lifestyle choices and visits with your health care provider that can promote health and wellness. This includes:  A yearly physical exam. This is also called an annual well check.  Regular dental and eye exams.  Immunizations.  Screening for certain conditions.  Healthy lifestyle choices, such as eating a healthy diet, getting regular exercise, not using drugs or products that contain nicotine and tobacco, and limiting alcohol use. What can I expect for my preventive care visit? Physical exam Your health care provider will check:  Height and weight. These may be used to calculate body mass index (BMI), which is a measurement that tells if you are at a healthy weight.  Heart rate and blood pressure.  Your skin for abnormal spots. Counseling Your health care provider may ask you questions about:  Alcohol, tobacco, and drug use.  Emotional well-being.  Home and relationship well-being.  Sexual activity.  Eating habits.  Work and work environment. What immunizations do I need?  Influenza (flu) vaccine  This is recommended every year. Tetanus, diphtheria, and pertussis (Tdap) vaccine  You may need a Td booster every 10 years. Varicella (chickenpox) vaccine  You may need this vaccine if you have not already been vaccinated. Zoster (shingles) vaccine  You may need this after age 60. Measles, mumps, and rubella (MMR) vaccine  You may need at least one dose of MMR if you were born in 1957 or later. You may also need a second dose. Pneumococcal conjugate (PCV13)  vaccine  You may need this if you have certain conditions and were not previously vaccinated. Pneumococcal polysaccharide (PPSV23) vaccine  You may need one or two doses if you smoke cigarettes or if you have certain conditions. Meningococcal conjugate (MenACWY) vaccine  You may need this if you have certain conditions. Hepatitis A vaccine  You may need this if you have certain conditions or if you travel or work in places where you may be exposed to hepatitis A. Hepatitis B vaccine  You may need this if you have certain conditions or if you travel or work in places where you may be exposed to hepatitis B. Haemophilus influenzae type b (Hib) vaccine  You may need this if you have certain risk factors. Human papillomavirus (HPV) vaccine  If recommended by your health care provider, you may need three doses over 6 months. You may receive vaccines as individual doses or as more than one vaccine together in one shot (combination vaccines). Talk with your health care provider about the risks and benefits of combination vaccines. What tests do I need? Blood tests  Lipid and cholesterol levels. These may be checked every 5 years, or more frequently if you are over 50 years old.  Hepatitis C test.  Hepatitis B test. Screening  Lung cancer screening. You may have this screening every year starting at age 55 if you have a 30-pack-year history of smoking and currently smoke or have quit within the past 15 years.  Prostate cancer screening. Recommendations will vary depending on your family history and other risks.  Colorectal cancer   screening. All adults should have this screening starting at age 33 and continuing until age 88. Your health care provider may recommend screening at age 25 if you are at increased risk. You will have tests every 1-10 years, depending on your results and the type of screening test.  Diabetes screening. This is done by checking your blood sugar (glucose) after  you have not eaten for a while (fasting). You may have this done every 1-3 years.  Sexually transmitted disease (STD) testing. Follow these instructions at home: Eating and drinking  Eat a diet that includes fresh fruits and vegetables, whole grains, lean protein, and low-fat dairy products.  Take vitamin and mineral supplements as recommended by your health care provider.  Do not drink alcohol if your health care provider tells you not to drink.  If you drink alcohol: ? Limit how much you have to 0-2 drinks a day. ? Be aware of how much alcohol is in your drink. In the [place] one drink equals one 12 oz bottle of beer (355 mL), one 5 oz glass of wine (148 mL), or one 1 oz glass of hard liquor (44 mL). Lifestyle  Take daily care of your teeth and gums.  Stay active. Exercise for at least 30 minutes on 5 or more days each week.  Do not use any products that contain nicotine or tobacco, such as cigarettes, e-cigarettes, and chewing tobacco. If you need help quitting, ask your health care provider.  If you are sexually active, practice safe sex. Use a condom or other form of protection to prevent STIs (sexually transmitted infections).  Talk with your health care provider about taking a low-dose aspirin every day starting at age 1. What's next?  Go to your health care provider once a year for a well check visit.  Ask your health care provider how often you should have your eyes and teeth checked.  Stay up to date on all vaccines. This information is not intended to replace advice given to you by your health care provider. Make sure you discuss any questions you have with your health care provider. Document Released: 07/05/2015 Document Revised: 06/02/2018 Document Reviewed: 06/02/2018 Elsevier Patient Education  2020 [employer]. .

## 2019-01-13 NOTE — Progress Notes (Signed)
Patient presents to clinic today for annual exam.  Patient is fasting for labs.  Diet -- Is following with Nutritionist and Dietician. Has had 3 visits. Is exercising more and eating a well-balnaced diet. Has lost 15 pounds thus far.   Acute Concerns: Denies acute concerns today.   Chronic Issues: Multiple Sclerosis -- Followed by Neurology. No current issues with symptoms. Sees specialist once per year.  Health Maintenance: Immunizations -- UTD Colonoscopy -- Due. Agrees to screen. Average risk. Order placed.  Past Medical History:  Diagnosis Date  . History of chickenpox   . Low back pain   . Multiple sclerosis, relapsing-remitting (HCC) 07/23/2006 dx    Past Surgical History:  Procedure Laterality Date  . APPENDECTOMY  1994  . TONSILLECTOMY  1974    Current Outpatient Medications on File Prior to Visit  Medication Sig Dispense Refill  . interferon beta-1a (AVONEX PREFILLED) 30 MCG/0.5ML PSKT injection INJECT 0.5 MLS INTO THE MUSCLE EVERY 7 DAYS. 4 each 11   No current facility-administered medications on file prior to visit.     No Known Allergies  Family History  Problem Relation Age of Onset  . Breast cancer Mother 968  . Hyperlipidemia Mother   . Alcohol abuse Maternal Uncle   . Lung cancer Maternal Aunt 75  . Birth defects Sister     Social History   Socioeconomic History  . Marital status: Married    Spouse name: Not on file  . Number of children: 2  . Years of education: COLLEGE  . Highest education level: Not on file  Occupational History  . Occupation: JUDGE  Social Needs  . Financial resource strain: Not on file  . Food insecurity    Worry: Not on file    Inability: Not on file  . Transportation needs    Medical: Not on file    Non-medical: Not on file  Tobacco Use  . Smoking status: Former Smoker    Types: Cigarettes    Quit date: 06/22/2004    Years since quitting: 14.5  . Smokeless tobacco: Never Used  . Tobacco comment: JD,  criminal Therapist, occupationalmagistrate. Married, lives with wife and 2 sons  Substance and Sexual Activity  . Alcohol use: No  . Drug use: No  . Sexual activity: Yes  Lifestyle  . Physical activity    Days per week: Not on file    Minutes per session: Not on file  . Stress: Not on file  Relationships  . Social Musicianconnections    Talks on phone: Not on file    Gets together: Not on file    Attends religious service: Not on file    Active member of club or organization: Not on file    Attends meetings of clubs or organizations: Not on file    Relationship status: Not on file  . Intimate partner violence    Fear of current or ex partner: Not on file    Emotionally abused: Not on file    Physically abused: Not on file    Forced sexual activity: Not on file  Other Topics Concern  . Not on file  Social History Narrative   Patient drinks 1-2 cups of caffeine daily.   Patient is right handed.    Review of Systems  Constitutional: Negative for fever and weight loss.  HENT: Negative for ear discharge, ear pain, hearing loss and tinnitus.   Eyes: Negative for blurred vision, double vision, photophobia and pain.  Respiratory: Negative for  cough and shortness of breath.   Cardiovascular: Negative for chest pain and palpitations.  Gastrointestinal: Negative for abdominal pain, blood in stool, constipation, diarrhea, heartburn, melena, nausea and vomiting.  Genitourinary: Negative for dysuria, flank pain, frequency, hematuria and urgency.       Nocturia x 0  Musculoskeletal: Negative for falls.  Neurological: Negative for dizziness, loss of consciousness and headaches.  Endo/Heme/Allergies: Negative for environmental allergies.  Psychiatric/Behavioral: Negative for depression, hallucinations, substance abuse and suicidal ideas. The patient is not nervous/anxious and does not have insomnia.    BP 118/70   Pulse 71   Temp 98.3 F (36.8 C) (Skin)   Resp 16   Ht 6' 0.5" (1.842 m)   Wt 218 lb (98.9 kg)    SpO2 98%   BMI 29.16 kg/m   Physical Exam Vitals signs reviewed.  Constitutional:      General: He is not in acute distress.    Appearance: He is well-developed. He is not diaphoretic.  HENT:     Head: Normocephalic and atraumatic.     Right Ear: Tympanic membrane, ear canal and external ear normal.     Left Ear: Tympanic membrane, ear canal and external ear normal.     Nose: Nose normal.     Mouth/Throat:     Pharynx: No posterior oropharyngeal erythema.  Eyes:     Conjunctiva/sclera: Conjunctivae normal.     Pupils: Pupils are equal, round, and reactive to light.  Neck:     Musculoskeletal: Neck supple.     Thyroid: No thyromegaly.  Cardiovascular:     Rate and Rhythm: Normal rate and regular rhythm.     Heart sounds: Normal heart sounds.  Pulmonary:     Effort: Pulmonary effort is normal. No respiratory distress.     Breath sounds: Normal breath sounds. No wheezing or rales.  Chest:     Chest wall: No tenderness.  Abdominal:     General: Bowel sounds are normal. There is no distension.     Palpations: Abdomen is soft. There is no mass.     Tenderness: There is no abdominal tenderness. There is no guarding or rebound.  Lymphadenopathy:     Cervical: No cervical adenopathy.  Skin:    General: Skin is warm and dry.     Findings: No rash.  Neurological:     Mental Status: He is alert and oriented to person, place, and time.     Cranial Nerves: No cranial nerve deficit.    Assessment/Plan: 1. Visit for preventive health examination Depression screen negative. Health Maintenance reviewed. Preventive schedule discussed and handout given in AVS. Will obtain fasting labs today. - CBC with Differential/Platelet - Hemoglobin A1c - Comprehensive metabolic panel - Lipid panel  2. Prostate cancer screening The natural history of prostate cancer and ongoing controversy regarding screening and potential treatment outcomes of prostate cancer has been discussed with the  patient. The meaning of a false positive PSA and a false negative PSA has been discussed. He indicates understanding of the limitations of this screening test and wishes to proceed with screening PSA testing.  - PSA  3. Colon cancer screening Referral to GI placed for screening colonoscopy. - Ambulatory referral to Gastroenterology   Leeanne Rio, PA-C

## 2019-01-16 MED FILL — AVONEX PREFILLED SYR 30 MCG: 30 | 28 days supply | Qty: 1 | Fill #5

## 2019-02-02 ENCOUNTER — Encounter: Payer: 59 | Attending: Physician Assistant | Admitting: Dietician

## 2019-02-02 ENCOUNTER — Other Ambulatory Visit: Payer: Self-pay

## 2019-02-02 DIAGNOSIS — Z713 Dietary counseling and surveillance: Secondary | ICD-10-CM | POA: Insufficient documentation

## 2019-02-02 NOTE — Progress Notes (Signed)
Medical Nutrition Therapy  Appt Start Time: 7:30am  End Time: 7:45am  [facility] Employee Wellness Visit 3 of 3 Wife's Employee ID# [id number]  Primary concerns today: weight management, MS   Preferred learning style: no preference indicated Learning readiness: contemplating   NUTRITION ASSESSMENT   Anthropometrics   Body Composition Scale  01/11/2019 02/02/2019  Body Weight 215.3 declined  Total Body Fat % 23.7   Visceral Fat 15   Fat-Free Mass % 76.2    Total Body Water % 57.2    Muscle-Mass lbs 40.5   BMI 29.2     Clinical Medical Hx: MS Surgeries: appendectomy  Medications: avonex   Psychosocial/Lifestyle 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 Ex-military, served ~10 years. Pt lives with his wife and their 2 boys, 32 and 48 years old.    Food & Nutrition Related Hx Dietary Hx: States he does well with incremental, small changes.  States he has been physically active in ways other than walking on the treadmill, such as swimming. Does better with drinking water when it is in sight/within reach, otherwise he does not drink much water. States he had a physical since last visit, and that his triglyceride levels were starting to go up and he is concerned about this. States his doctor recommended krill oil supplement for this.   Physical Activity  Current average weekly physical activity: walking on the treadmill daily, lifting weights at home (weight bench), playing basketball with the kids    Estimated Energy Needs Calories: 2200 Carbohydrate: 248g Protein: 138g Fat: 73g  Body Composition Scale Readings Activity Level Calories/Day  BMR 1858  Very Light 2230  Light 2602  Moderate 2787  Heavy 3159  Very Heavy 3531    NUTRITION INTERVENTION  Nutrition education (E-1) on the following topics:   Triglycerides  Handouts Provided Include   Types of Fat   Learning Style & Readiness for Change Teaching method  utilized: Visual & Auditory  Demonstrated degree of understanding via: Teach Back  Barriers to learning/adherence to lifestyle change: MS, taste preferences, contemplative stage of change   Goals Established by Pt  Look at the Nutrition Facts label on foods/snacks currently eating. If it does not have at least 3 grams of fiber, try to find an alternative that does.  Aim to continue being physically active by walking on the treadmill daily.  Aim to drink more water, starting by keeping some water with you during workouts.  Patient states his overall goal it to help manage his MS, and verbalized that he notices positive changes day to day when he maintains physical activity (walking on the treadmill every night) and that this is important to him. Additionally, he was happy to see a loss of 2-3 lbs, stating he feared his weight went up despite his efforts in being more physically active (inclusing lifting weights) and making dietary changes (not eating Poptarts for breakfast every morning.) States seeing weight loss is a goal that is secondary to managing his MS.  Patient states that he likes goals and does well when he sees progress. Verbalized that he liked seeing the Body Composition Scale reading and looks forward to using it as a baseline to compare changes over time. States he would like to move forward by prioritizing physical activity as his main goal, followed by increasing fluid (especially water) intake. Later, pt states he would like to make small changes to diet as well.  MONITORING & EVALUATION Dietary intake, weekly physical activity, and goals PRN.  RD's Notes for Next Visit  . Meal ideas, or tweaks to breakfast/lunch/dinners (make SMALL changes at a time)   Next Steps  Patient is to contact [facility] in the future for follow up as needed/desired. States he would like to return in the future depending on work schedule.

## 2019-02-13 MED FILL — AVONEX PREFILLED SYR 30 MCG: 30 | 28 days supply | Qty: 1 | Fill #6

## 2019-03-13 MED FILL — AVONEX PREFILLED SYR 30 MCG: 30 | 28 days supply | Qty: 1 | Fill #7

## 2019-03-17 ENCOUNTER — Encounter: Payer: Self-pay | Admitting: Physician Assistant

## 2019-03-31 ENCOUNTER — Ambulatory Visit (INDEPENDENT_AMBULATORY_CARE_PROVIDER_SITE_OTHER): Payer: 59

## 2019-03-31 ENCOUNTER — Other Ambulatory Visit: Payer: Self-pay

## 2019-03-31 DIAGNOSIS — Z23 Encounter for immunization: Secondary | ICD-10-CM

## 2019-04-10 MED FILL — AVONEX PREFILLED SYR 30 MCG: 30 | 28 days supply | Qty: 1 | Fill #8

## 2019-05-08 MED FILL — AVONEX PREFILLED SYR 30 MCG: 30 | 28 days supply | Qty: 1 | Fill #9

## 2019-06-05 MED FILL — AVONEX PREFILLED SYR 30 MCG: 30 | 28 days supply | Qty: 1 | Fill #10

## 2019-06-19 ENCOUNTER — Encounter: Payer: Self-pay | Admitting: Neurology

## 2019-06-19 ENCOUNTER — Other Ambulatory Visit: Payer: Self-pay

## 2019-06-19 ENCOUNTER — Ambulatory Visit (INDEPENDENT_AMBULATORY_CARE_PROVIDER_SITE_OTHER): Payer: 59 | Admitting: Neurology

## 2019-06-19 VITALS — BP 134/90 | HR 76 | Temp 98.0°F | Ht 72.0 in | Wt 225.0 lb

## 2019-06-19 DIAGNOSIS — G35 Multiple sclerosis: Secondary | ICD-10-CM

## 2019-06-19 MED ORDER — AVONEX PREFILLED 30 MCG/0.5ML IM PSKT: PREFILLED_SYRINGE | INTRAMUSCULAR | 11 refills | Status: DC

## 2019-06-19 NOTE — Patient Instructions (Addendum)
Continue Avonex, I have sent a refill. Return in 1 year or sooner if needed

## 2019-06-19 NOTE — Progress Notes (Signed)
PATIENT: Dennis Leonard DOB: 1965/11/28  REASON FOR VISIT: follow up HISTORY FROM: patient  HISTORY OF PRESENT ILLNESS: Today 06/19/19  [name] is a 53 year old male with history of relapsing remitting multiple sclerosis.  He remains on Avonex.  Last MRI of the brain was in 2018, no change from 2010.  MRI of the cervical spine showed mild multilevel degenerative disc changes, no significant stenosis.  He works as a Neurosurgeon.  He indicates his symptoms have been stable since initial diagnosis.  He may occasionally drag his left leg, or have word finding difficulty, otherwise no overt symptoms.  He has not had any falls. The symptoms are not constant.  He denies any new numbness or weakness to his arms or legs, changes to his vision, or changes to his bowels or bladder.  He indicates his overall health has been well.  He presents today for evaluation unaccompanied.  HISTORY  HISTORY OF PRESENT ILLNESS:UPDATE 1/15/2020CM [name], 53 year old male returns for follow-up with history of relapsing remitting multiple sclerosis.  He is currently on Avonex and is tolerated the medication well with no injection site issues.  He has been stable without exacerbation of symptoms.  Last MRI of the brain in 2018 without change from 2010.  MRI of the cervical spine with mild multilevel degenerative disc changes no significant stenosis.  Patient works as a Neurosurgeon.  He denies any new sensory changes weakness balance issues visual changes.  He has not had problems with bowel or bladder he exercises on the treadmill daily.  No interval medical issues.  He returns for reevaluation  REVIEW OF SYSTEMS: Out of a complete 14 system review of symptoms, the patient complains only of the following symptoms, and all other reviewed systems are negative.  Speech difficulty  ALLERGIES: No Known Allergies  HOME MEDICATIONS: Outpatient Medications Prior to Visit  Medication Sig Dispense Refill  . interferon beta-1a  (AVONEX PREFILLED) 30 MCG/0.5ML PSKT injection INJECT 0.5 MLS INTO THE MUSCLE EVERY 7 DAYS. 4 each 11   No facility-administered medications prior to visit.    PAST MEDICAL HISTORY: Past Medical History:  Diagnosis Date  . History of chickenpox   . Low back pain   . Multiple sclerosis, relapsing-remitting ([place]) 07/23/2006 dx    PAST SURGICAL HISTORY: Past Surgical History:  Procedure Laterality Date  . APPENDECTOMY  1994  . TONSILLECTOMY  1974    FAMILY HISTORY: Family History  Problem Relation Age of Onset  . Breast cancer Mother 33  . Hyperlipidemia Mother   . Alcohol abuse Maternal Uncle   . Lung cancer Maternal Aunt 75  . Birth defects Sister     SOCIAL HISTORY: Social History   Socioeconomic History  . Marital status: Married    Spouse name: Not on file  . Number of children: 2  . Years of education: COLLEGE  . Highest education level: Not on file  Occupational History  . Occupation: JUDGE  Tobacco Use  . Smoking status: Former Smoker    Types: Cigarettes    Quit date: 06/22/2004    Years since quitting: 15.0  . Smokeless tobacco: Never Used  . Tobacco comment: JD, criminal Civil Service fast streamer. Married, lives with wife and 2 sons  Substance and Sexual Activity  . Alcohol use: No  . Drug use: No  . Sexual activity: Yes  Other Topics Concern  . Not on file  Social History Narrative   Patient drinks 1-2 cups of caffeine daily.   Patient is right  handed.   Social Determinants of Health   Financial Resource Strain:   . Difficulty of Paying Living Expenses: Not on file  Food Insecurity:   . Worried About Programme researcher, broadcasting/film/video in the Last Year: Not on file  . Ran Out of Food in the Last Year: Not on file  Transportation Needs:   . Lack of Transportation (Medical): Not on file  . Lack of Transportation (Non-Medical): Not on file  Physical Activity:   . Days of Exercise per Week: Not on file  . Minutes of Exercise per Session: Not on file  Stress:   . Feeling of  Stress : Not on file  Social Connections:   . Frequency of Communication with Friends and Family: Not on file  . Frequency of Social Gatherings with Friends and Family: Not on file  . Attends Religious Services: Not on file  . Active Member of Clubs or Organizations: Not on file  . Attends Banker Meetings: Not on file  . Marital Status: Not on file  Intimate Partner Violence:   . Fear of Current or Ex-Partner: Not on file  . Emotionally Abused: Not on file  . Physically Abused: Not on file  . Sexually Abused: Not on file      PHYSICAL EXAM  Vitals:   06/19/19 1056  BP: 134/90  Pulse: 76  Temp: 98 F (36.7 C)  Weight: 225 lb (102.1 kg)  Height: 6' (1.829 m)   Body mass index is 30.52 kg/m.  Generalized: Well developed, in no acute distress   Neurological examination  Mentation: Alert oriented to time, place, history taking. Follows all commands speech and language fluent Cranial nerve II-XII: Pupils were equal round reactive to light. Extraocular movements were full, visual field were full on confrontational test. Facial sensation and strength were normal. Head turning and shoulder shrug  were normal and symmetric. Motor: The motor testing reveals 5 over 5 strength of all 4 extremities. Good symmetric motor tone is noted throughout.  Sensory: Sensory testing is intact to soft touch on all 4 extremities. No evidence of extinction is noted.  Coordination: Cerebellar testing reveals good finger-nose-finger and heel-to-shin bilaterally.  Gait and station: Gait is normal. Tandem gait is normal. Romberg is negative. No drift is seen.  Reflexes: Deep tendon reflexes are symmetric and normal bilaterally.   DIAGNOSTIC DATA (LABS, IMAGING, TESTING) - I reviewed patient records, labs, notes, testing and imaging myself where available.  Lab Results  Component Value Date   WBC 5.2 01/13/2019   HGB 15.8 01/13/2019   HCT 46.4 01/13/2019   MCV 97.7 01/13/2019   PLT  329.0 01/13/2019      Component Value Date/Time   NA 141 01/13/2019 0902   NA 143 07/06/2018 1556   K 4.9 01/13/2019 0902   CL 107 01/13/2019 0902   CO2 29 01/13/2019 0902   GLUCOSE 94 01/13/2019 0902   BUN 15 01/13/2019 0902   BUN 14 07/06/2018 1556   CREATININE 1.04 01/13/2019 0902   CALCIUM 9.9 01/13/2019 0902   PROT 6.9 01/13/2019 0902   PROT 7.2 07/06/2018 1556   ALBUMIN 4.4 01/13/2019 0902   ALBUMIN 4.5 07/06/2018 1556   AST 18 01/13/2019 0902   ALT 27 01/13/2019 0902   ALKPHOS 42 01/13/2019 0902   BILITOT 0.7 01/13/2019 0902   BILITOT 0.4 07/06/2018 1556   GFRNONAA 75 07/06/2018 1556   GFRAA 86 07/06/2018 1556   Lab Results  Component Value Date   CHOL 161  01/13/2019   HDL 36.00 (L) 01/13/2019   LDLDIRECT 81.0 01/13/2019   TRIG 344.0 (H) 01/13/2019   CHOLHDL 4 01/13/2019   Lab Results  Component Value Date   HGBA1C 5.1 01/13/2019   No results found for: VITAMINB12 Lab Results  Component Value Date   TSH 1.57 08/25/2011    ASSESSMENT AND PLAN 53 y.o. year old male  has a past medical history of History of chickenpox, Low back pain, and Multiple sclerosis, relapsing-remitting (HCC) (07/23/2006 dx). here with:  1.  Relapsing remitting multiple sclerosis  Mr. Slomka has continued to do quite well with his MS.  He will remain on Avonex, laboratory evaluation CBC, CMP was reviewed and was normal from July 2020.  He last had MRI of the brain and cervical spine in 2018, that showed very little change from 2010.  He has remained quite stable on Avonex.  We discussed possibly considering repeat MRI of the brain and cervical spine in 2021.  He will follow-up in 1 year or sooner if needed.  I did advise if his symptoms worsen or if he develops any new symptoms he should let us know.  I spent 15 minutes with the patient. 50% of this time was spent discussing his plan of care.  Margie Ege, AGNP-C, DNP 06/19/2019, 11:15 AM [facility] [address]  [address] [place], [place] [address] [phone]

## 2019-06-20 NOTE — Progress Notes (Signed)
I have read the note, and I agree with the clinical assessment and plan.  Shaheen Mende K Kit Mollett   

## 2019-07-05 ENCOUNTER — Telehealth: Payer: Self-pay

## 2019-07-05 NOTE — Telephone Encounter (Signed)
Pending renewal for Avonex 30 mcg/0.5 mL syringe kit Key: [id number]  I will update once a decision has been made.

## 2019-07-06 MED FILL — AVONEX PREFILLED SYR 30 MCG: 30 | 28 days supply | Qty: 1 | Fill #11

## 2019-07-09 ENCOUNTER — Encounter: Payer: Self-pay | Admitting: Physician Assistant

## 2019-07-10 ENCOUNTER — Ambulatory Visit: Payer: 59 | Admitting: Neurology

## 2019-07-17 NOTE — Telephone Encounter (Signed)
The request has been approved. The authorization is effective for a maximum of 12 fills from 07/07/2019 to 07/05/2020, as long as the member is enrolled in their current health plan. The request was approved as submitted. This request has been approved for 1 kit (4 syringes) per 28 days. Please note: The plan requires this medication must be filled and/or managed by a [facility], called [phone]. A written notification letter will follow with additional details.

## 2019-07-27 ENCOUNTER — Other Ambulatory Visit: Payer: Self-pay | Admitting: Neurology

## 2019-07-27 ENCOUNTER — Other Ambulatory Visit: Payer: Self-pay | Admitting: Internal Medicine

## 2019-08-01 ENCOUNTER — Other Ambulatory Visit: Payer: Self-pay | Admitting: Pharmacist

## 2019-08-01 ENCOUNTER — Other Ambulatory Visit: Payer: Self-pay | Admitting: Neurology

## 2019-08-01 MED ORDER — AVONEX PREFILLED 30 MCG/0.5ML IM PSKT: PREFILLED_SYRINGE | INTRAMUSCULAR | 11 refills | Status: DC

## 2019-08-04 ENCOUNTER — Ambulatory Visit: Payer: 59 | Attending: Internal Medicine

## 2019-08-04 DIAGNOSIS — Z20822 Contact with and (suspected) exposure to covid-19: Secondary | ICD-10-CM | POA: Diagnosis not present

## 2019-08-05 LAB — NOVEL CORONAVIRUS, NAA: SARS-CoV-2, NAA: NOT DETECTED

## 2019-08-09 ENCOUNTER — Ambulatory Visit: Payer: 59 | Attending: Internal Medicine

## 2019-08-09 DIAGNOSIS — Z20822 Contact with and (suspected) exposure to covid-19: Secondary | ICD-10-CM | POA: Diagnosis not present

## 2019-08-10 ENCOUNTER — Other Ambulatory Visit: Payer: 59

## 2019-08-10 LAB — NOVEL CORONAVIRUS, NAA: SARS-CoV-2, NAA: NOT DETECTED

## 2019-08-11 ENCOUNTER — Other Ambulatory Visit: Payer: 59

## 2019-08-11 MED FILL — METHOTREXATE SODIUM 2.5 MG: 2.5 | 28 days supply | Qty: 16 | Fill #0

## 2019-08-12 MED FILL — FOLIC ACID 1 MG TABS: 1 | 30 days supply | Qty: 30 | Fill #0

## 2019-08-16 MED FILL — AVONEX PREFILLED SYR 30 MCG: 30 | 28 days supply | Qty: 1 | Fill #0

## 2019-09-11 MED FILL — FOLIC ACID 1 MG TABS: 1 | 30 days supply | Qty: 30 | Fill #1

## 2019-09-11 MED FILL — AVONEX PREFILLED SYR 30 MCG: 30 | 28 days supply | Qty: 1 | Fill #1

## 2019-09-11 MED FILL — METHOTREXATE SODIUM 2.5 MG: 2.5 | 28 days supply | Qty: 16 | Fill #1

## 2019-09-15 ENCOUNTER — Ambulatory Visit (HOSPITAL_BASED_OUTPATIENT_CLINIC_OR_DEPARTMENT_OTHER): Payer: 59 | Admitting: Pharmacist

## 2019-09-15 ENCOUNTER — Other Ambulatory Visit: Payer: Self-pay

## 2019-09-15 DIAGNOSIS — Z79899 Other long term (current) drug therapy: Secondary | ICD-10-CM

## 2019-09-15 NOTE — Progress Notes (Signed)
S: Patient is currently taking Avonex for multiple sclerosis. Patient is managed by Dr. Anne Hahn for this.   Adherence: denies any missed doses. Last seen by Neuro 06/19/19. Avonex was continued at that appointment.   Efficacy - per last neuro note, everything is stable.   Current adverse effects:  Labs: WNL (including LFTs) Flu-like symptoms: denies any recent sx Neuropsychiatric side effects: denies  O:  Lab Results  Component Value Date   WBC 5.2 01/13/2019   HGB 15.8 01/13/2019   HCT 46.4 01/13/2019   MCV 97.7 01/13/2019   PLT 329.0 01/13/2019      Chemistry      Component Value Date/Time   NA 141 01/13/2019 0902   NA 143 07/06/2018 1556   K 4.9 01/13/2019 0902   CL 107 01/13/2019 0902   CO2 29 01/13/2019 0902   BUN 15 01/13/2019 0902   BUN 14 07/06/2018 1556   CREATININE 1.04 01/13/2019 0902      Component Value Date/Time   CALCIUM 9.9 01/13/2019 0902   ALKPHOS 42 01/13/2019 0902   AST 18 01/13/2019 0902   ALT 27 01/13/2019 0902   BILITOT 0.7 01/13/2019 0902   BILITOT 0.4 07/06/2018 1556       A/P: 1. Medication review: patient continues to tolerate and be stable on Avonex for MS with no adverse effects. Disease is stable per neuro.  Reviewed Avonex, including injection technique and adverse effects. No recommendations for any changes at this time.  Butch Penny, PharmD, CPP Clinical Pharmacist [facility] [phone]

## 2019-09-27 MED FILL — METHOTREXATE SODIUM 2.5 MG: 2.5 | 28 days supply | Qty: 24 | Fill #0

## 2019-10-11 MED FILL — AVONEX PREFILLED SYR 30 MCG: 30 | 28 days supply | Qty: 1 | Fill #2

## 2019-11-06 MED FILL — METHOTREXATE SODIUM 2.5 MG: 2.5 | 28 days supply | Qty: 24 | Fill #1

## 2019-11-06 MED FILL — AVONEX PREFILLED SYR 30 MCG: 30 | 28 days supply | Qty: 1 | Fill #3

## 2019-11-09 DIAGNOSIS — L400 Psoriasis vulgaris: Secondary | ICD-10-CM | POA: Diagnosis not present

## 2019-11-09 DIAGNOSIS — Z79899 Other long term (current) drug therapy: Secondary | ICD-10-CM | POA: Diagnosis not present

## 2019-11-09 MED FILL — BETAMETHASONE DP AUG 0.05%: 0.05 | 30 days supply | Qty: 50 | Fill #0

## 2019-12-11 MED FILL — AVONEX PREFILLED SYR 30 MCG: 30 | 28 days supply | Qty: 1 | Fill #4

## 2019-12-20 ENCOUNTER — Encounter: Payer: 59 | Admitting: Physician Assistant

## 2019-12-20 MED FILL — METHOTREXATE 2.5 MG TAB: 2.5 | 28 days supply | Qty: 24 | Fill #3

## 2020-01-15 MED FILL — FOLIC ACID 1 MG TABS: 1 | 30 days supply | Qty: 30 | Fill #2

## 2020-01-16 ENCOUNTER — Other Ambulatory Visit (HOSPITAL_COMMUNITY): Payer: Self-pay | Admitting: Dermatology

## 2020-01-16 MED FILL — METHOTREXATE 2.5 MG TAB: 2.5 | 28 days supply | Qty: 32 | Fill #0

## 2020-02-13 MED FILL — METHOTREXATE SODIUM 2.5 MG: 2.5 | 28 days supply | Qty: 32 | Fill #1

## 2020-02-19 MED FILL — AVONEX PREFILLED SYR 30 MCG: 30 | 28 days supply | Qty: 1 | Fill #5

## 2020-02-22 DIAGNOSIS — Z79899 Other long term (current) drug therapy: Secondary | ICD-10-CM | POA: Diagnosis not present

## 2020-02-22 DIAGNOSIS — L400 Psoriasis vulgaris: Secondary | ICD-10-CM | POA: Diagnosis not present

## 2020-03-18 MED FILL — FOLIC ACID 1 MG TABS: 1 | 30 days supply | Qty: 30 | Fill #3

## 2020-03-18 MED FILL — AVONEX PREFILLED SYR 30 MCG: 30 | 28 days supply | Qty: 1 | Fill #6

## 2020-04-02 MED FILL — METHOTREXATE SODIUM 2.5 MG: 2.5 | 28 days supply | Qty: 32 | Fill #2

## 2020-04-16 MED FILL — AVONEX PREFILLED SYR 30 MCG: 30 | 28 days supply | Qty: 1 | Fill #7

## 2020-04-16 MED FILL — FOLIC ACID 1 MG TABS: 1 | 30 days supply | Qty: 30 | Fill #4

## 2020-05-09 ENCOUNTER — Ambulatory Visit (INDEPENDENT_AMBULATORY_CARE_PROVIDER_SITE_OTHER): Payer: 59

## 2020-05-09 ENCOUNTER — Other Ambulatory Visit: Payer: Self-pay

## 2020-05-09 ENCOUNTER — Encounter: Payer: Self-pay | Admitting: Physician Assistant

## 2020-05-09 DIAGNOSIS — Z23 Encounter for immunization: Secondary | ICD-10-CM

## 2020-05-10 MED FILL — AVONEX PREFILLED SYR 30 MCG: 30 | 28 days supply | Qty: 1 | Fill #8

## 2020-05-17 MED FILL — METHOTREXATE SODIUM 2.5 MG: 2.5 | 28 days supply | Qty: 32 | Fill #3

## 2020-06-05 ENCOUNTER — Other Ambulatory Visit: Payer: Self-pay

## 2020-06-05 ENCOUNTER — Ambulatory Visit (INDEPENDENT_AMBULATORY_CARE_PROVIDER_SITE_OTHER): Payer: 59 | Admitting: Otolaryngology

## 2020-06-05 ENCOUNTER — Encounter (INDEPENDENT_AMBULATORY_CARE_PROVIDER_SITE_OTHER): Payer: Self-pay | Admitting: Otolaryngology

## 2020-06-05 VITALS — Temp 97.3°F

## 2020-06-05 DIAGNOSIS — H6123 Impacted cerumen, bilateral: Secondary | ICD-10-CM | POA: Diagnosis not present

## 2020-06-05 NOTE — Progress Notes (Signed)
HPI: Dennis Leonard is a 54 y.o. male who presents for evaluation of wax buildup in both ears much worse on the right side.  He has had this for a couple months.  He has previously been followed by Dr. Dorma Russell has had previous surgery on the right side.  He has had no drainage from the ear..  Past Medical History:  Diagnosis Date  . History of chickenpox   . Low back pain   . Multiple sclerosis, relapsing-remitting (HCC) 07/23/2006 dx   Past Surgical History:  Procedure Laterality Date  . APPENDECTOMY  1994  . TONSILLECTOMY  1974   Social History   Socioeconomic History  . Marital status: Married    Spouse name: Not on file  . Number of children: 2  . Years of education: COLLEGE  . Highest education level: Not on file  Occupational History  . Occupation: JUDGE  Tobacco Use  . Smoking status: Former Smoker    Types: Cigarettes    Quit date: 06/22/2004    Years since quitting: 15.9  . Smokeless tobacco: Never Used  . Tobacco comment: JD, criminal Therapist, occupational. Married, lives with wife and 2 sons  Vaping Use  . Vaping Use: Never used  Substance and Sexual Activity  . Alcohol use: No  . Drug use: No  . Sexual activity: Yes  Other Topics Concern  . Not on file  Social History Narrative   Patient drinks 1-2 cups of caffeine daily.   Patient is right handed.   Social Determinants of Health   Financial Resource Strain: Not on file  Food Insecurity: Not on file  Transportation Needs: Not on file  Physical Activity: Not on file  Stress: Not on file  Social Connections: Not on file   Family History  Problem Relation Age of Onset  . Breast cancer Mother 65  . Hyperlipidemia Mother   . Alcohol abuse Maternal Uncle   . Lung cancer Maternal Aunt 75  . Birth defects Sister    No Known Allergies Prior to Admission medications   Medication Sig Start Date End Date Taking? Authorizing Provider  interferon beta-1a (AVONEX PREFILLED) 30 MCG/0.5ML PSKT injection INJECT 0.5 MLS INTO  THE MUSCLE EVERY 7 DAYS. 08/01/19   Quentin Angst, MD     Positive ROS: Otherwise negative  All other systems have been reviewed and were otherwise negative with the exception of those mentioned in the HPI and as above.  Physical Exam: Constitutional: Alert, well-appearing, no acute distress Ears: External ears without lesions or tenderness. Ear canals with a large amount of wax buildup in both ear canals but much worse on the right side.  The right ear was cleaned with a suction.  Of note he has some granulation tissue posteriorly superiorly that bled rather easily.  I irrigated the ear with hydroperoxide and suction.  This area was cauterized using silver nitrate posterior superiorly where he had exophytic granulation tissue.  The TM itself was intact with no perforation and no evidence of cholesteatoma.  After cleaning the ear canal with his hearing was much better.  On the left side he had a large amount of wax that was cleaned with curette suction.  The TM itself was clear.  On hearing screening he heard about the same in both ears perhaps little bit better on the right side.  AC was greater than BC bilaterally.. Nasal: External nose without lesions. Clear nasal passages Oral: Oropharynx clear. Neck: No palpable adenopathy or masses Respiratory: Breathing comfortably  Skin: No facial/neck lesions or rash noted.  Cerumen impaction removal  Date/Time: 06/05/2020 11:38 AM Performed by: Drema Halon, MD Authorized by: Drema Halon, MD   Consent:    Consent obtained:  Verbal   Consent given by:  Patient   Risks discussed:  Pain and bleeding Procedure details:    Location:  L ear and R ear   Procedure type: curette, irrigation and suction   Post-procedure details:    Inspection:  TM intact and canal normal   Hearing quality:  Improved   Patient tolerance of procedure:  Tolerated well, no immediate complications Comments:     Ear canals were cleaned bilaterally  with hydroperoxide and suction.  TMs were intact bilaterally.  Of note patient has some granulation tissue posterior superiorly in the right ear canal that bled easily with suction.  I cauterized this using silver nitrate.    Assessment: Bilateral cerumen impactions.  Plan: This was cleaned in the office with improved hearing. He will follow-up as needed.  Narda Bonds, MD

## 2020-06-11 ENCOUNTER — Other Ambulatory Visit (HOSPITAL_COMMUNITY): Payer: Self-pay | Admitting: Dermatology

## 2020-06-11 DIAGNOSIS — L853 Xerosis cutis: Secondary | ICD-10-CM | POA: Diagnosis not present

## 2020-06-11 DIAGNOSIS — Z79899 Other long term (current) drug therapy: Secondary | ICD-10-CM | POA: Diagnosis not present

## 2020-06-11 DIAGNOSIS — L400 Psoriasis vulgaris: Secondary | ICD-10-CM | POA: Diagnosis not present

## 2020-06-12 ENCOUNTER — Other Ambulatory Visit (HOSPITAL_COMMUNITY): Payer: Self-pay | Admitting: Dermatology

## 2020-06-12 MED FILL — METHOTREXATE SODIUM 2.5 MG: 2.5 | 28 days supply | Qty: 24 | Fill #0

## 2020-06-12 MED FILL — FOLIC ACID 1 MG TABS: 1 | 30 days supply | Qty: 30 | Fill #0

## 2020-06-17 MED FILL — AVONEX PREFILLED SYR 30 MCG: 30 | 28 days supply | Qty: 1 | Fill #9

## 2020-06-18 ENCOUNTER — Ambulatory Visit (INDEPENDENT_AMBULATORY_CARE_PROVIDER_SITE_OTHER): Payer: 59 | Admitting: Neurology

## 2020-06-18 ENCOUNTER — Encounter: Payer: Self-pay | Admitting: Neurology

## 2020-06-18 ENCOUNTER — Other Ambulatory Visit: Payer: Self-pay

## 2020-06-18 VITALS — BP 120/85 | HR 79 | Ht 73.0 in | Wt 225.0 lb

## 2020-06-18 DIAGNOSIS — G35 Multiple sclerosis: Secondary | ICD-10-CM | POA: Diagnosis not present

## 2020-06-18 MED ORDER — AVONEX PREFILLED 30 MCG/0.5ML IM PSKT: PREFILLED_SYRINGE | INTRAMUSCULAR | 11 refills | Status: DC

## 2020-06-18 NOTE — Progress Notes (Signed)
PATIENT: Dennis Leonard DOB: 19-Mar-1966  REASON FOR VISIT: follow up HISTORY FROM: patient  HISTORY OF PRESENT ILLNESS: Today 06/18/20 Dennis Leonard is a 54 year old male with history of relapsing remitting multiple sclerosis.  He remains on Avonex.  He works as a Education administrator.  Last MRI of the brain was in 2018, no change from 2010.  MRI of cervical spine showed mild multilevel degenerative disc changes, no significant stenosis (patchy cord signal at C2, C3, and C4).  For MS, he may occasionally drag his left leg or have word finding difficulties, as been chronic.  Is overall stable.  Wishes he were more active. His wife is an NP, they have 2 kids (10 and 14).  Tolerates Avonex well.  Does wonder about coming off the medication.  Presents today for evaluation unaccompanied.  HISTORY 06/19/2019 SS: Dennis Leonard is a 54 year old male with history of relapsing remitting multiple sclerosis.  He remains on Avonex.  Last MRI of the brain was in 2018, no change from 2010.  MRI of the cervical spine showed mild multilevel degenerative disc changes, no significant stenosis.  He works as a Education administrator.  He indicates his symptoms have been stable since initial diagnosis.  He may occasionally drag his left leg, or have word finding difficulty, otherwise no overt symptoms.  He has not had any falls. The symptoms are not constant.  He denies any new numbness or weakness to his arms or legs, changes to his vision, or changes to his bowels or bladder.  He indicates his overall health has been well.  He presents today for evaluation unaccompanied.   REVIEW OF SYSTEMS: Out of a complete 14 system review of symptoms, the patient complains only of the following symptoms, and all other reviewed systems are negative.  N/a  ALLERGIES: No Known Allergies  HOME MEDICATIONS: Outpatient Medications Prior to Visit  Medication Sig Dispense Refill   folic acid (FOLVITE) 1 MG tablet Take 1 mg by mouth daily.     methotrexate 2.5 MG  tablet Take 15 mg by mouth once a week.     interferon beta-1a (AVONEX PREFILLED) 30 MCG/0.5ML PSKT injection INJECT 0.5 MLS INTO THE MUSCLE EVERY 7 DAYS. 4 each 11   No facility-administered medications prior to visit.    PAST MEDICAL HISTORY: Past Medical History:  Diagnosis Date   History of chickenpox    Low back pain    Multiple sclerosis, relapsing-remitting (HCC) 07/23/2006 dx    PAST SURGICAL HISTORY: Past Surgical History:  Procedure Laterality Date   APPENDECTOMY  1994   TONSILLECTOMY  1974    FAMILY HISTORY: Family History  Problem Relation Age of Onset   Breast cancer Mother 25   Hyperlipidemia Mother    Alcohol abuse Maternal Uncle    Lung cancer Maternal Aunt 67   Birth defects Sister     SOCIAL HISTORY: Social History   Socioeconomic History   Marital status: Married    Spouse name: Not on file   Number of children: 2   Years of education: COLLEGE   Highest education level: Not on file  Occupational History   Occupation: JUDGE  Tobacco Use   Smoking status: Former Smoker    Types: Cigarettes    Quit date: 06/22/2004    Years since quitting: 16.0   Smokeless tobacco: Never Used   Tobacco comment: JD, Optometrist. Married, lives with wife and 2 sons  Vaping Use   Vaping Use: Never used  Substance and Sexual Activity  Alcohol use: No   Drug use: No   Sexual activity: Yes  Other Topics Concern   Not on file  Social History Narrative   Patient drinks 1-2 cups of caffeine daily.   Patient is right handed.   Social Determinants of Health   Financial Resource Strain: Not on file  Food Insecurity: Not on file  Transportation Needs: Not on file  Physical Activity: Not on file  Stress: Not on file  Social Connections: Not on file  Intimate Partner Violence: Not on file   PHYSICAL EXAM  Vitals:   06/18/20 1050  BP: 120/85  Pulse: 79  Weight: 225 lb (102.1 kg)  Height: 6\' 1"  (1.854 m)   Body mass index is  29.69 kg/m.  Generalized: Well developed, in no acute distress   Neurological examination  Mentation: Alert oriented to time, place, history taking. Follows all commands speech and language fluent Cranial nerve II-XII: Pupils were equal round reactive to light. Extraocular movements were full, visual field were full on confrontational test. Facial sensation and strength were normal.  Head turning and shoulder shrug  were normal and symmetric. Motor: The motor testing reveals 5 over 5 strength of all 4 extremities. Good symmetric motor tone is noted throughout.  Sensory: Sensory testing is intact to soft touch on all 4 extremities. No evidence of extinction is noted.  Coordination: Cerebellar testing reveals good finger-nose-finger and heel-to-shin bilaterally.  Gait and station: Gait is normal. Tandem gait is normal. Romberg is negative. No drift is seen.  Reflexes: Deep tendon reflexes are symmetric but slightly increased throughout.   DIAGNOSTIC DATA (LABS, IMAGING, TESTING) - I reviewed patient records, labs, notes, testing and imaging myself where available.  Lab Results  Component Value Date   WBC 5.2 01/13/2019   HGB 15.8 01/13/2019   HCT 46.4 01/13/2019   MCV 97.7 01/13/2019   PLT 329.0 01/13/2019      Component Value Date/Time   NA 141 01/13/2019 0902   NA 143 07/06/2018 1556   K 4.9 01/13/2019 0902   CL 107 01/13/2019 0902   CO2 29 01/13/2019 0902   GLUCOSE 94 01/13/2019 0902   BUN 15 01/13/2019 0902   BUN 14 07/06/2018 1556   CREATININE 1.04 01/13/2019 0902   CALCIUM 9.9 01/13/2019 0902   PROT 6.9 01/13/2019 0902   PROT 7.2 07/06/2018 1556   ALBUMIN 4.4 01/13/2019 0902   ALBUMIN 4.5 07/06/2018 1556   AST 18 01/13/2019 0902   ALT 27 01/13/2019 0902   ALKPHOS 42 01/13/2019 0902   BILITOT 0.7 01/13/2019 0902   BILITOT 0.4 07/06/2018 1556   GFRNONAA 75 07/06/2018 1556   GFRAA 86 07/06/2018 1556   Lab Results  Component Value Date   CHOL 161 01/13/2019   HDL  36.00 (L) 01/13/2019   LDLDIRECT 81.0 01/13/2019   TRIG 344.0 (H) 01/13/2019   CHOLHDL 4 01/13/2019   Lab Results  Component Value Date   HGBA1C 5.1 01/13/2019   No results found for: VITAMINB12 Lab Results  Component Value Date   TSH 1.57 08/25/2011    ASSESSMENT AND PLAN 54 y.o. year old male  has a past medical history of History of chickenpox, Low back pain, and Multiple sclerosis, relapsing-remitting (HCC) (07/23/2006 dx). here with:  1.  Relapsing remitting multiple sclerosis  Has remained overall stable. Will check MRI of the brain and cervical spine with and without contrast for surveillance.  Check routine labs.  He will remain on Avonex.  He does mention trying  off medication completely, I am reluctant to recommend this given potential risk for exacerbation (MRI cervical spine has shown patchy cord signal at C2, C3, and C4).   Follow-up in 1 year or sooner if needed.  I spent 30 minutes of face-to-face and non-face-to-face time with patient. This included previsit chart review, lab review, study review, order entry, electronic health record documentation, patient education.  Margie Ege, AGNP-C, DNP 06/18/2020, 11:37 AM [facility] [address] [place], [place] [address] [phone]

## 2020-06-18 NOTE — Patient Instructions (Addendum)
Continue Avenox for now Check MRIs  Check labs today  See you back in 1 year

## 2020-06-19 ENCOUNTER — Telehealth: Payer: Self-pay

## 2020-06-19 LAB — COMPREHENSIVE METABOLIC PANEL
ALT: 29 IU/L (ref 0–44)
AST: 22 IU/L (ref 0–40)
Albumin/Globulin Ratio: 1.7 (ref 1.2–2.2)
Albumin: 4.4 g/dL (ref 3.8–4.9)
Alkaline Phosphatase: 50 IU/L (ref 44–121)
BUN/Creatinine Ratio: 9 (ref 9–20)
BUN: 10 mg/dL (ref 6–24)
Bilirubin Total: 0.3 mg/dL (ref 0.0–1.2)
CO2: 26 mmol/L (ref 20–29)
Calcium: 9.4 mg/dL (ref 8.7–10.2)
Chloride: 105 mmol/L (ref 96–106)
Creatinine, Ser: 1.06 mg/dL (ref 0.76–1.27)
GFR calc Af Amer: 93 mL/min/{1.73_m2} (ref >59)
GFR calc non Af Amer: 80 mL/min/{1.73_m2} (ref >59)
Globulin, Total: 2.6 g/dL (ref 1.5–4.5)
Glucose: 85 mg/dL (ref 65–99)
Potassium: 4.8 mmol/L (ref 3.5–5.2)
Sodium: 142 mmol/L (ref 134–144)
Total Protein: 7 g/dL (ref 6.0–8.5)

## 2020-06-19 LAB — CBC WITH DIFFERENTIAL/PLATELET
Basophils Absolute: 0 10*3/uL (ref 0.0–0.2)
Basos: 1 %
EOS (ABSOLUTE): 0.1 10*3/uL (ref 0.0–0.4)
Eos: 2 %
Hematocrit: 47.5 % (ref 37.5–51.0)
Hemoglobin: 16.6 g/dL (ref 13.0–17.7)
Immature Grans (Abs): 0.1 10*3/uL (ref 0.0–0.1)
Immature Granulocytes: 1 %
Lymphocytes Absolute: 1.6 10*3/uL (ref 0.7–3.1)
Lymphs: 35 %
MCH: 33.7 pg — ABNORMAL HIGH (ref 26.6–33.0)
MCHC: 34.9 g/dL (ref 31.5–35.7)
MCV: 97 fL (ref 79–97)
Monocytes Absolute: 0.9 10*3/uL (ref 0.1–0.9)
Monocytes: 19 %
Neutrophils Absolute: 1.9 10*3/uL (ref 1.4–7.0)
Neutrophils: 42 %
Platelets: 310 10*3/uL (ref 150–450)
RBC: 4.92 x10E6/uL (ref 4.14–5.80)
RDW: 14 % (ref 11.6–15.4)
WBC: 4.5 10*3/uL (ref 3.4–10.8)

## 2020-06-19 NOTE — Telephone Encounter (Signed)
message sent to [facility]

## 2020-06-19 NOTE — Progress Notes (Signed)
I have read the note, and I agree with the clinical assessment and plan.  Wendell Nicoson K Kadience Macchi   

## 2020-06-19 NOTE — Telephone Encounter (Signed)
-----   Message from Glean Salvo, NP sent at 06/19/2020  6:08 AM EST ----- Labs are unremarkable. Good news :)

## 2020-06-25 ENCOUNTER — Telehealth: Payer: Self-pay | Admitting: Neurology

## 2020-06-25 NOTE — Telephone Encounter (Signed)
[facility] no auth lvm for pt to call me back if he would like to go to [facility] or at [facility]

## 2020-07-08 ENCOUNTER — Other Ambulatory Visit: Payer: Self-pay | Admitting: Pharmacist

## 2020-07-08 MED ORDER — AVONEX PREFILLED 30 MCG/0.5ML IM PSKT: PREFILLED_SYRINGE | INTRAMUSCULAR | 11 refills | Status: DC

## 2020-07-13 MED FILL — METHOTREXATE SODIUM 2.5 MG: 2.5 | 28 days supply | Qty: 24 | Fill #1

## 2020-07-25 ENCOUNTER — Telehealth: Payer: Self-pay | Admitting: Emergency Medicine

## 2020-07-25 MED FILL — AVONEX PREFILLED SYR 30 MCG: 30 | 28 days supply | Qty: 1 | Fill #0

## 2020-07-25 NOTE — Telephone Encounter (Signed)
The request has been approved. The authorization is effective for a maximum of 12 fills from 07/25/2020 to 07/24/2021, as long as the member is enrolled in their current health plan. This has been approved for a quantity limit of 4.0 with a day supply limit of 28.0.

## 2020-07-25 NOTE — Telephone Encounter (Signed)
Avonex PA started on CMM.  Key: [id number]  Awaiting determination from [employer] 2017.

## 2020-08-19 MED FILL — METHOTREXATE SODIUM 2.5 MG: 2.5 | 28 days supply | Qty: 24 | Fill #2

## 2020-08-21 MED FILL — AVONEX PREFILLED SYR 30 MCG: 30 | 28 days supply | Qty: 1 | Fill #1

## 2020-09-09 ENCOUNTER — Other Ambulatory Visit: Payer: Self-pay

## 2020-09-09 ENCOUNTER — Ambulatory Visit: Payer: 59 | Attending: Family Medicine | Admitting: Pharmacist

## 2020-09-09 DIAGNOSIS — Z79899 Other long term (current) drug therapy: Secondary | ICD-10-CM

## 2020-09-09 DIAGNOSIS — L400 Psoriasis vulgaris: Secondary | ICD-10-CM | POA: Diagnosis not present

## 2020-09-09 NOTE — Progress Notes (Signed)
S: Patient is currently taking Avonex for multiple sclerosis. Patient is managed by Dr. Anne Hahn for this.   Adherence: denies any missed doses. Last seen by Neuro 06/18/20. Avonex was continued at that appointment.   Efficacy - per last neuro note, everything is stable.   Current adverse effects:  Labs: WNL (including LFTs) Flu-like symptoms: denies any recent sx Neuropsychiatric side effects: denies  O:  Lab Results  Component Value Date   WBC 4.5 06/18/2020   HGB 16.6 06/18/2020   HCT 47.5 06/18/2020   MCV 97 06/18/2020   PLT 310 06/18/2020      Chemistry      Component Value Date/Time   NA 142 06/18/2020 1138   K 4.8 06/18/2020 1138   CL 105 06/18/2020 1138   CO2 26 06/18/2020 1138   BUN 10 06/18/2020 1138   CREATININE 1.06 06/18/2020 1138      Component Value Date/Time   CALCIUM 9.4 06/18/2020 1138   ALKPHOS 50 06/18/2020 1138   AST 22 06/18/2020 1138   ALT 29 06/18/2020 1138   BILITOT 0.3 06/18/2020 1138       A/P: 1. Medication review: patient continues to tolerate and be stable on Avonex for MS with no adverse effects. Disease is stable per neuro.  Reviewed Avonex, including injection technique and adverse effects. No recommendations for any changes at this time.  Butch Penny, PharmD, [place]y [place], CPP Clinical Pharmacist [facility] [phone]

## 2020-09-11 MED FILL — FOLIC ACID 1 MG TABS: 1 | 30 days supply | Qty: 30 | Fill #1

## 2020-09-11 MED FILL — METHOTREXATE SODIUM 2.5 MG: 2.5 | 28 days supply | Qty: 24 | Fill #3

## 2020-09-20 ENCOUNTER — Other Ambulatory Visit (HOSPITAL_COMMUNITY): Payer: Self-pay

## 2020-09-21 ENCOUNTER — Other Ambulatory Visit (HOSPITAL_COMMUNITY): Payer: Self-pay

## 2020-10-14 ENCOUNTER — Other Ambulatory Visit (HOSPITAL_COMMUNITY): Payer: Self-pay

## 2020-10-14 MED FILL — Interferon Beta-1a IM Prefilled Syringe Kit 30 MCG/0.5ML: INTRAMUSCULAR | 28 days supply | Qty: 1 | Fill #0 | Status: AC

## 2020-10-14 MED FILL — Methotrexate Sodium Tab 2.5 MG (Base Equiv): ORAL | 28 days supply | Qty: 24 | Fill #0 | Status: AC

## 2020-10-15 ENCOUNTER — Other Ambulatory Visit (HOSPITAL_COMMUNITY): Payer: Self-pay

## 2020-10-16 ENCOUNTER — Other Ambulatory Visit (HOSPITAL_COMMUNITY): Payer: Self-pay

## 2020-10-17 ENCOUNTER — Other Ambulatory Visit (HOSPITAL_COMMUNITY): Payer: Self-pay

## 2020-10-28 ENCOUNTER — Other Ambulatory Visit (HOSPITAL_COMMUNITY): Payer: Self-pay

## 2020-10-28 ENCOUNTER — Other Ambulatory Visit (HOSPITAL_BASED_OUTPATIENT_CLINIC_OR_DEPARTMENT_OTHER): Payer: Self-pay

## 2020-10-28 MED FILL — Folic Acid Tab 1 MG: ORAL | 30 days supply | Qty: 30 | Fill #0 | Status: AC

## 2020-11-11 ENCOUNTER — Other Ambulatory Visit (HOSPITAL_COMMUNITY): Payer: Self-pay

## 2020-11-11 MED FILL — Methotrexate Sodium Tab 2.5 MG (Base Equiv): ORAL | 28 days supply | Qty: 24 | Fill #1 | Status: AC

## 2020-11-12 ENCOUNTER — Other Ambulatory Visit (HOSPITAL_COMMUNITY): Payer: Self-pay

## 2020-11-12 MED FILL — Interferon Beta-1a IM Prefilled Syringe Kit 30 MCG/0.5ML: INTRAMUSCULAR | 28 days supply | Qty: 1 | Fill #1 | Status: AC

## 2020-11-22 ENCOUNTER — Other Ambulatory Visit (HOSPITAL_BASED_OUTPATIENT_CLINIC_OR_DEPARTMENT_OTHER): Payer: Self-pay

## 2020-11-25 ENCOUNTER — Other Ambulatory Visit (HOSPITAL_COMMUNITY): Payer: Self-pay

## 2020-11-26 ENCOUNTER — Other Ambulatory Visit (HOSPITAL_BASED_OUTPATIENT_CLINIC_OR_DEPARTMENT_OTHER): Payer: Self-pay

## 2020-12-06 DIAGNOSIS — Z1211 Encounter for screening for malignant neoplasm of colon: Secondary | ICD-10-CM | POA: Diagnosis not present

## 2020-12-06 DIAGNOSIS — Z713 Dietary counseling and surveillance: Secondary | ICD-10-CM | POA: Diagnosis not present

## 2020-12-06 DIAGNOSIS — Z125 Encounter for screening for malignant neoplasm of prostate: Secondary | ICD-10-CM | POA: Diagnosis not present

## 2020-12-06 DIAGNOSIS — E559 Vitamin D deficiency, unspecified: Secondary | ICD-10-CM | POA: Diagnosis not present

## 2020-12-06 DIAGNOSIS — Z7182 Exercise counseling: Secondary | ICD-10-CM | POA: Diagnosis not present

## 2020-12-06 DIAGNOSIS — Z13220 Encounter for screening for lipoid disorders: Secondary | ICD-10-CM | POA: Diagnosis not present

## 2020-12-06 DIAGNOSIS — Z0000 Encounter for general adult medical examination without abnormal findings: Secondary | ICD-10-CM | POA: Diagnosis not present

## 2020-12-11 ENCOUNTER — Other Ambulatory Visit (HOSPITAL_BASED_OUTPATIENT_CLINIC_OR_DEPARTMENT_OTHER): Payer: Self-pay

## 2020-12-11 ENCOUNTER — Other Ambulatory Visit (HOSPITAL_COMMUNITY): Payer: Self-pay

## 2020-12-11 MED FILL — Methotrexate Sodium Tab 2.5 MG (Base Equiv): ORAL | 28 days supply | Qty: 24 | Fill #2 | Status: CN

## 2020-12-11 MED FILL — Methotrexate Sodium Tab 2.5 MG (Base Equiv): ORAL | 14 days supply | Qty: 16 | Fill #2 | Status: AC

## 2020-12-11 MED FILL — Folic Acid Tab 1 MG: ORAL | 30 days supply | Qty: 30 | Fill #1 | Status: AC

## 2020-12-12 ENCOUNTER — Other Ambulatory Visit (HOSPITAL_BASED_OUTPATIENT_CLINIC_OR_DEPARTMENT_OTHER): Payer: Self-pay

## 2020-12-12 ENCOUNTER — Other Ambulatory Visit (HOSPITAL_COMMUNITY): Payer: Self-pay

## 2020-12-13 ENCOUNTER — Other Ambulatory Visit (HOSPITAL_COMMUNITY): Payer: Self-pay

## 2020-12-16 ENCOUNTER — Other Ambulatory Visit (HOSPITAL_COMMUNITY): Payer: Self-pay

## 2020-12-17 ENCOUNTER — Other Ambulatory Visit (HOSPITAL_COMMUNITY): Payer: Self-pay

## 2020-12-27 ENCOUNTER — Other Ambulatory Visit (HOSPITAL_COMMUNITY): Payer: Self-pay

## 2020-12-27 MED FILL — Interferon Beta-1a IM Prefilled Syringe Kit 30 MCG/0.5ML: INTRAMUSCULAR | 28 days supply | Qty: 1 | Fill #2 | Status: AC

## 2021-01-05 ENCOUNTER — Other Ambulatory Visit: Payer: Self-pay

## 2021-01-06 ENCOUNTER — Other Ambulatory Visit (HOSPITAL_COMMUNITY): Payer: Self-pay

## 2021-01-06 MED ORDER — METHOTREXATE 2.5 MG PO TABS
ORAL_TABLET | ORAL | 5 refills | Status: DC
  Filled 2021-01-06 – 2021-02-25 (×3): qty 32, 28d supply, fill #0
  Filled 2021-04-21: qty 32, 28d supply, fill #1
  Filled 2021-06-17: qty 32, 28d supply, fill #2
  Filled 2021-08-10: qty 32, 28d supply, fill #3
  Filled 2021-10-07: qty 32, 28d supply, fill #4

## 2021-01-07 ENCOUNTER — Other Ambulatory Visit (HOSPITAL_BASED_OUTPATIENT_CLINIC_OR_DEPARTMENT_OTHER): Payer: Self-pay

## 2021-01-09 DIAGNOSIS — Z79899 Other long term (current) drug therapy: Secondary | ICD-10-CM | POA: Diagnosis not present

## 2021-01-09 DIAGNOSIS — L400 Psoriasis vulgaris: Secondary | ICD-10-CM | POA: Diagnosis not present

## 2021-02-03 ENCOUNTER — Other Ambulatory Visit (HOSPITAL_COMMUNITY): Payer: Self-pay

## 2021-02-03 MED FILL — Interferon Beta-1a IM Prefilled Syringe Kit 30 MCG/0.5ML: INTRAMUSCULAR | 28 days supply | Qty: 1 | Fill #3 | Status: AC

## 2021-02-03 MED FILL — Folic Acid Tab 1 MG: ORAL | 30 days supply | Qty: 30 | Fill #2 | Status: AC

## 2021-02-04 ENCOUNTER — Other Ambulatory Visit (HOSPITAL_COMMUNITY): Payer: Self-pay

## 2021-02-04 ENCOUNTER — Other Ambulatory Visit (HOSPITAL_BASED_OUTPATIENT_CLINIC_OR_DEPARTMENT_OTHER): Payer: Self-pay

## 2021-02-26 ENCOUNTER — Other Ambulatory Visit (HOSPITAL_BASED_OUTPATIENT_CLINIC_OR_DEPARTMENT_OTHER): Payer: Self-pay

## 2021-02-27 ENCOUNTER — Other Ambulatory Visit (HOSPITAL_COMMUNITY): Payer: Self-pay

## 2021-02-28 ENCOUNTER — Other Ambulatory Visit (HOSPITAL_BASED_OUTPATIENT_CLINIC_OR_DEPARTMENT_OTHER): Payer: Self-pay

## 2021-03-03 ENCOUNTER — Other Ambulatory Visit (HOSPITAL_COMMUNITY): Payer: Self-pay

## 2021-03-03 MED FILL — Interferon Beta-1a IM Prefilled Syringe Kit 30 MCG/0.5ML: INTRAMUSCULAR | 28 days supply | Qty: 1 | Fill #4 | Status: AC

## 2021-03-10 ENCOUNTER — Other Ambulatory Visit (HOSPITAL_BASED_OUTPATIENT_CLINIC_OR_DEPARTMENT_OTHER): Payer: Self-pay

## 2021-03-10 ENCOUNTER — Other Ambulatory Visit (HOSPITAL_COMMUNITY): Payer: Self-pay

## 2021-03-31 ENCOUNTER — Other Ambulatory Visit (HOSPITAL_COMMUNITY): Payer: Self-pay

## 2021-03-31 MED FILL — Interferon Beta-1a IM Prefilled Syringe Kit 30 MCG/0.5ML: INTRAMUSCULAR | 28 days supply | Qty: 1 | Fill #5 | Status: AC

## 2021-04-04 ENCOUNTER — Other Ambulatory Visit (HOSPITAL_COMMUNITY): Payer: Self-pay

## 2021-04-04 MED FILL — Folic Acid Tab 1 MG: ORAL | 30 days supply | Qty: 30 | Fill #3 | Status: AC

## 2021-04-07 ENCOUNTER — Other Ambulatory Visit (HOSPITAL_BASED_OUTPATIENT_CLINIC_OR_DEPARTMENT_OTHER): Payer: Self-pay

## 2021-04-15 ENCOUNTER — Other Ambulatory Visit (HOSPITAL_COMMUNITY): Payer: Self-pay

## 2021-04-15 ENCOUNTER — Other Ambulatory Visit (HOSPITAL_BASED_OUTPATIENT_CLINIC_OR_DEPARTMENT_OTHER): Payer: Self-pay

## 2021-04-22 ENCOUNTER — Other Ambulatory Visit (HOSPITAL_BASED_OUTPATIENT_CLINIC_OR_DEPARTMENT_OTHER): Payer: Self-pay

## 2021-05-06 ENCOUNTER — Other Ambulatory Visit (HOSPITAL_COMMUNITY): Payer: Self-pay

## 2021-05-06 MED FILL — Interferon Beta-1a IM Prefilled Syringe Kit 30 MCG/0.5ML: INTRAMUSCULAR | 28 days supply | Qty: 1 | Fill #6 | Status: AC

## 2021-05-09 ENCOUNTER — Other Ambulatory Visit (HOSPITAL_COMMUNITY): Payer: Self-pay

## 2021-05-12 ENCOUNTER — Other Ambulatory Visit (HOSPITAL_BASED_OUTPATIENT_CLINIC_OR_DEPARTMENT_OTHER): Payer: Self-pay

## 2021-05-20 DIAGNOSIS — Z79899 Other long term (current) drug therapy: Secondary | ICD-10-CM | POA: Diagnosis not present

## 2021-05-20 DIAGNOSIS — L400 Psoriasis vulgaris: Secondary | ICD-10-CM | POA: Diagnosis not present

## 2021-06-03 ENCOUNTER — Other Ambulatory Visit (HOSPITAL_COMMUNITY): Payer: Self-pay

## 2021-06-04 ENCOUNTER — Other Ambulatory Visit (HOSPITAL_COMMUNITY): Payer: Self-pay

## 2021-06-04 MED FILL — Interferon Beta-1a IM Prefilled Syringe Kit 30 MCG/0.5ML: INTRAMUSCULAR | 28 days supply | Qty: 1 | Fill #7 | Status: AC

## 2021-06-05 ENCOUNTER — Other Ambulatory Visit (HOSPITAL_COMMUNITY): Payer: Self-pay

## 2021-06-17 ENCOUNTER — Other Ambulatory Visit (HOSPITAL_BASED_OUTPATIENT_CLINIC_OR_DEPARTMENT_OTHER): Payer: Self-pay

## 2021-06-17 ENCOUNTER — Other Ambulatory Visit: Payer: Self-pay

## 2021-06-17 MED ORDER — FOLIC ACID 1 MG PO TABS
1.0000 mg | ORAL_TABLET | Freq: Every day | ORAL | 2 refills | Status: DC
  Filled 2021-06-17: qty 30, 30d supply, fill #0
  Filled 2021-08-10: qty 30, 30d supply, fill #1
  Filled 2021-10-07: qty 30, 30d supply, fill #2

## 2021-06-18 ENCOUNTER — Encounter: Payer: Self-pay | Admitting: Neurology

## 2021-06-18 ENCOUNTER — Ambulatory Visit (INDEPENDENT_AMBULATORY_CARE_PROVIDER_SITE_OTHER): Payer: 59 | Admitting: Neurology

## 2021-06-18 ENCOUNTER — Other Ambulatory Visit (HOSPITAL_BASED_OUTPATIENT_CLINIC_OR_DEPARTMENT_OTHER): Payer: Self-pay

## 2021-06-18 VITALS — BP 125/81 | HR 98 | Ht 73.0 in | Wt 225.0 lb

## 2021-06-18 DIAGNOSIS — G35 Multiple sclerosis: Secondary | ICD-10-CM

## 2021-06-18 MED ORDER — INTERFERON BETA-1A 30 MCG/0.5ML IM PSKT
PREFILLED_SYRINGE | INTRAMUSCULAR | 11 refills | Status: DC
  Filled 2021-06-18: qty 1, fill #0

## 2021-06-18 NOTE — Progress Notes (Signed)
PATIENT: Dennis Leonard DOB: [birth year]  REASON FOR VISIT: follow up for MS HISTORY FROM: patient  Primary Neurologist: Dr. Marjory Lies since Dr. Anne Hahn retired  HISTORY OF PRESENT ILLNESS: Today 06/18/21 Dennis Leonard here today for follow-up with history of RRMS on Avonex.  Has remained stable.  He works as a Education administrator.  He may notice some difficulty with word finding, or occasionally drag his left leg.  Otherwise no issues.  Has been stable for about 15 years.  MRIs were ordered last year, but were not completed.  No new issues. His wife is an NP, she does his injections, has tolerated well.  Here today unaccompanied.  Update 06/18/2020 SS: Dennis Leonard is a 55 year old male with history of relapsing remitting multiple sclerosis.  He remains on Avonex.  He works as a Education administrator.  Last MRI of the brain was in 2018, no change from 2010.  MRI of cervical spine showed mild multilevel degenerative disc changes, no significant stenosis (patchy cord signal at C2, C3, and C4).  For MS, he may occasionally drag his left leg or have word finding difficulties, as been chronic.  Is overall stable.  Wishes he were more active. His wife is an NP, they have 2 kids (10 and 14).  Tolerates Avonex well.  Does wonder about coming off the medication.  Presents today for evaluation unaccompanied.  HISTORY 06/19/2019 SS: Dennis Leonard is a 55 year old male with history of relapsing remitting multiple sclerosis.  He remains on Avonex.  Last MRI of the brain was in 2018, no change from 2010.  MRI of the cervical spine showed mild multilevel degenerative disc changes, no significant stenosis.  He works as a Education administrator.  He indicates his symptoms have been stable since initial diagnosis.  He may occasionally drag his left leg, or have word finding difficulty, otherwise no overt symptoms.  He has not had any falls. The symptoms are not constant.  He denies any new numbness or weakness to his arms or legs, changes to his vision, or changes to  his bowels or bladder.  He indicates his overall health has been well.  He presents today for evaluation unaccompanied.   REVIEW OF SYSTEMS: Out of a complete 14 system review of symptoms, the patient complains only of the following symptoms, and all other reviewed systems are negative.  See HPI  ALLERGIES: No Known Allergies  HOME MEDICATIONS: Outpatient Medications Prior to Visit  Medication Sig Dispense Refill   folic acid (FOLVITE) 1 MG tablet Take 1 tablet by mouth once a day 30 tablet 2   methotrexate (RHEUMATREX) 2.5 MG tablet Take 8 tablets by mouth one day per week as directed 32 tablet 5   interferon beta-1a (AVONEX) 30 MCG/0.5ML PSKT injection INJECT 0.5 MLS INTO THE MUSCLE EVERY 7 DAYS. 1 each 11   folic acid (FOLVITE) 1 MG tablet Take 1 mg by mouth daily.     methotrexate 2.5 MG tablet Take 15 mg by mouth once a week.     No facility-administered medications prior to visit.    PAST MEDICAL HISTORY: Past Medical History:  Diagnosis Date   History of chickenpox    Low back pain    Multiple sclerosis, relapsing-remitting (HCC) 07/23/2006 dx    PAST SURGICAL HISTORY: Past Surgical History:  Procedure Laterality Date   APPENDECTOMY  1994   TONSILLECTOMY  1974    FAMILY HISTORY: Family History  Problem Relation Age of Onset   Breast cancer Mother 71   Hyperlipidemia  Mother    Alcohol abuse Maternal Uncle    Lung cancer Maternal Aunt 46   Birth defects Sister     SOCIAL HISTORY: Social History   Socioeconomic History   Marital status: Married    Spouse name: Not on file   Number of children: 2   Years of education: COLLEGE   Highest education level: Not on file  Occupational History   Occupation: JUDGE  Tobacco Use   Smoking status: Former    Types: Cigarettes    Quit date: 06/22/2004    Years since quitting: 17.0   Smokeless tobacco: Never   Tobacco comments:    JD, Optometrist. 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   Patient is right handed.   Social Determinants of Health   Financial Resource Strain: Not on file  Food Insecurity: Not on file  Transportation Needs: Not on file  Physical Activity: Not on file  Stress: Not on file  Social Connections: Not on file  Intimate Partner Violence: Not on file   PHYSICAL EXAM  Vitals:   06/18/21 1120  BP: 125/81  Pulse: 98  Weight: 225 lb (102.1 kg)  Height: 6\' 1"  (1.854 m)    Body mass index is 29.69 kg/m.  Generalized: Well developed, in no acute distress  Neurological examination  Mentation: Alert oriented to time, place, history taking. Follows all commands speech and language fluent Cranial nerve II-XII: Pupils were equal round reactive to light. Extraocular movements were full, visual field were full on confrontational test. Facial sensation and strength were normal.  Head turning and shoulder shrug  were normal and symmetric. Motor: The motor testing reveals 5 over 5 strength of all 4 extremities. Good symmetric motor tone is noted throughout.  Sensory: Sensory testing is intact to soft touch on all 4 extremities. No evidence of extinction is noted.  Coordination: Cerebellar testing reveals good finger-nose-finger and heel-to-shin bilaterally.  Gait and station: Gait is normal. Tandem gait is normal.  Reflexes: Deep tendon reflexes are symmetric but slightly increased throughout.   DIAGNOSTIC DATA (LABS, IMAGING, TESTING) - I reviewed patient records, labs, notes, testing and imaging myself where available.  Lab Results  Component Value Date   WBC 4.5 06/18/2020   HGB 16.6 06/18/2020   HCT 47.5 06/18/2020   MCV 97 06/18/2020   PLT 310 06/18/2020      Component Value Date/Time   NA 142 06/18/2020 1138   K 4.8 06/18/2020  1138   CL 105 06/18/2020 1138   CO2 26 06/18/2020 1138   GLUCOSE 85 06/18/2020 1138   GLUCOSE 94 01/13/2019 0902   BUN 10 06/18/2020 1138   CREATININE 1.06 06/18/2020 1138   CALCIUM 9.4 06/18/2020 1138   PROT 7.0 06/18/2020 1138   ALBUMIN 4.4 06/18/2020 1138   AST 22 06/18/2020 1138   ALT 29 06/18/2020 1138   ALKPHOS 50 06/18/2020 1138   BILITOT 0.3 06/18/2020 1138   GFRNONAA 80 06/18/2020 1138   GFRAA 93 06/18/2020 1138   Lab Results  Component Value Date   CHOL 161 01/13/2019   HDL 36.00 (L) 01/13/2019   LDLDIRECT 81.0 01/13/2019   TRIG 344.0 (H) 01/13/2019   CHOLHDL 4 01/13/2019  Lab Results  Component Value Date   HGBA1C 5.1 01/13/2019   No results found for: VITAMINB12 Lab Results  Component Value Date   TSH 1.57 08/25/2011    ASSESSMENT AND PLAN 55 y.o. year old male  has a past medical history of History of chickenpox, Low back pain, and Multiple sclerosis, relapsing-remitting (HCC) (07/23/2006 dx). here with:  1.  Relapsing remitting multiple sclerosis  -Dennis Leonard continues to do well, has remained stable on Avonex for about 15 years -Will check MRI of the brain and cervical spine with and without contrast for routine surveillance -Had recent labs done at dermatology, is on methotrexate, will send them over to me via my chart -He will be followed by Dr. Marjory Lies since Dr. Anne Hahn has now retired, I will see him back in 1 year or sooner if needed   Otila Kluver, DNP 06/18/2021, 11:48 AM [facility] [address] [place], [place] [address] [phone]

## 2021-06-18 NOTE — Patient Instructions (Signed)
Check MRI of the brain and cervical spine Send me your recent labs  Continue Avonex for now See you back in 1 year

## 2021-06-24 NOTE — Progress Notes (Signed)
I reviewed note and agree with plan.  ° °Rory Montel R. Aron Needles, MD 06/24/2021, 10:44 AM °Certified in Neurology, Neurophysiology and Neuroimaging ° °[facility] °[address], [address] °[place], [place] [address] °[phone] ° °

## 2021-06-25 ENCOUNTER — Other Ambulatory Visit (HOSPITAL_COMMUNITY): Payer: Self-pay

## 2021-06-25 ENCOUNTER — Other Ambulatory Visit: Payer: Self-pay | Admitting: Pharmacist

## 2021-06-25 MED ORDER — INTERFERON BETA-1A 30 MCG/0.5ML IM PSKT
PREFILLED_SYRINGE | INTRAMUSCULAR | 11 refills | Status: DC
  Filled 2021-06-25: qty 1, fill #0
  Filled 2021-07-29: qty 1, 28d supply, fill #0
  Filled 2021-09-01: qty 1, 28d supply, fill #1
  Filled 2021-10-10: qty 1, 28d supply, fill #2
  Filled 2021-11-21: qty 1, 28d supply, fill #3
  Filled 2022-01-19: qty 1, 28d supply, fill #4
  Filled 2022-02-24: qty 1, 28d supply, fill #5
  Filled 2022-03-31: qty 1, 28d supply, fill #6
  Filled 2022-04-23: qty 1, 28d supply, fill #7
  Filled 2022-05-21: qty 1, 28d supply, fill #8

## 2021-07-02 ENCOUNTER — Telehealth: Payer: Self-pay | Admitting: Neurology

## 2021-07-02 NOTE — Telephone Encounter (Signed)
Sent mychart message

## 2021-07-29 ENCOUNTER — Other Ambulatory Visit (HOSPITAL_COMMUNITY): Payer: Self-pay

## 2021-08-06 ENCOUNTER — Other Ambulatory Visit (HOSPITAL_COMMUNITY): Payer: Self-pay

## 2021-08-07 ENCOUNTER — Other Ambulatory Visit (HOSPITAL_COMMUNITY): Payer: Self-pay

## 2021-08-08 ENCOUNTER — Other Ambulatory Visit (HOSPITAL_COMMUNITY): Payer: Self-pay

## 2021-08-11 ENCOUNTER — Other Ambulatory Visit (HOSPITAL_BASED_OUTPATIENT_CLINIC_OR_DEPARTMENT_OTHER): Payer: Self-pay

## 2021-08-13 ENCOUNTER — Telehealth: Payer: Self-pay

## 2021-08-13 NOTE — Telephone Encounter (Signed)
I have submitted a PA request for Avonex on CMM, Key: [id number]. Awaiting determination.

## 2021-08-30 DIAGNOSIS — H5213 Myopia, bilateral: Secondary | ICD-10-CM | POA: Diagnosis not present

## 2021-09-01 ENCOUNTER — Other Ambulatory Visit (HOSPITAL_COMMUNITY): Payer: Self-pay

## 2021-09-08 ENCOUNTER — Ambulatory Visit: Payer: 59 | Attending: Physician Assistant | Admitting: Pharmacist

## 2021-09-08 ENCOUNTER — Other Ambulatory Visit: Payer: Self-pay

## 2021-09-08 DIAGNOSIS — Z79899 Other long term (current) drug therapy: Secondary | ICD-10-CM

## 2021-09-08 NOTE — Progress Notes (Signed)
S: ?Patient is currently taking Avonex for multiple sclerosis. Patient is managed by NP Margie Ege for this.  ? ?Adherence: denies any missed doses. Last seen by Neuro 06/18/21. Avonex was continued at that appointment. Mr was ordered, however, pt reported doing well at that visit. Clinically he remains stable.  ? ?Efficacy: medication continues to work well for him  ? ?Current adverse effects:  ?Labs: WNL (including LFTs) ?Flu-like symptoms: denies any recent sx ?Neuropsychiatric side effects: denies ? ?O: ? ?Lab Results  ?Component Value Date  ? WBC 4.5 06/18/2020  ? HGB 16.6 06/18/2020  ? HCT 47.5 06/18/2020  ? MCV 97 06/18/2020  ? PLT 310 06/18/2020  ? ? ?  Chemistry   ?   ?Component Value Date/Time  ? NA 142 06/18/2020 1138  ? K 4.8 06/18/2020 1138  ? CL 105 06/18/2020 1138  ? CO2 26 06/18/2020 1138  ? BUN 10 06/18/2020 1138  ? CREATININE 1.06 06/18/2020 1138  ?    ?Component Value Date/Time  ? CALCIUM 9.4 06/18/2020 1138  ? ALKPHOS 50 06/18/2020 1138  ? AST 22 06/18/2020 1138  ? ALT 29 06/18/2020 1138  ? BILITOT 0.3 06/18/2020 1138  ?  ? ? ?A/P: ?1. Medication review: patient continues to tolerate and be stable on Avonex for MS with no adverse effects. Disease is stable per neuro.  Reviewed Avonex, including injection technique and adverse effects. No recommendations for any changes at this time. ? ?Butch Penny, PharmD, BCACP, CPP ?Clinical Pharmacist ?[facility] ?[phone] ? ?   ? ? ?

## 2021-09-15 ENCOUNTER — Other Ambulatory Visit (HOSPITAL_COMMUNITY): Payer: Self-pay

## 2021-09-19 DIAGNOSIS — L400 Psoriasis vulgaris: Secondary | ICD-10-CM | POA: Diagnosis not present

## 2021-09-19 DIAGNOSIS — Z79899 Other long term (current) drug therapy: Secondary | ICD-10-CM | POA: Diagnosis not present

## 2021-10-07 ENCOUNTER — Other Ambulatory Visit (HOSPITAL_BASED_OUTPATIENT_CLINIC_OR_DEPARTMENT_OTHER): Payer: Self-pay

## 2021-10-10 ENCOUNTER — Other Ambulatory Visit (HOSPITAL_COMMUNITY): Payer: Self-pay

## 2021-10-29 ENCOUNTER — Other Ambulatory Visit (HOSPITAL_COMMUNITY): Payer: Self-pay

## 2021-10-30 ENCOUNTER — Other Ambulatory Visit (HOSPITAL_COMMUNITY): Payer: Self-pay

## 2021-10-31 ENCOUNTER — Other Ambulatory Visit (HOSPITAL_COMMUNITY): Payer: Self-pay

## 2021-10-31 ENCOUNTER — Encounter (HOSPITAL_BASED_OUTPATIENT_CLINIC_OR_DEPARTMENT_OTHER): Payer: Self-pay

## 2021-10-31 ENCOUNTER — Other Ambulatory Visit (HOSPITAL_BASED_OUTPATIENT_CLINIC_OR_DEPARTMENT_OTHER): Payer: Self-pay

## 2021-11-21 ENCOUNTER — Other Ambulatory Visit (HOSPITAL_COMMUNITY): Payer: Self-pay

## 2021-11-24 ENCOUNTER — Other Ambulatory Visit (HOSPITAL_BASED_OUTPATIENT_CLINIC_OR_DEPARTMENT_OTHER): Payer: Self-pay

## 2021-11-24 MED ORDER — METHOTREXATE 2.5 MG PO TABS
ORAL_TABLET | ORAL | 11 refills | Status: DC
  Filled 2021-11-24: qty 24, 28d supply, fill #0
  Filled 2021-12-24: qty 24, 28d supply, fill #1
  Filled 2022-01-20: qty 16, 18d supply, fill #2
  Filled 2022-01-21: qty 8, 10d supply, fill #2
  Filled 2022-02-19: qty 24, 28d supply, fill #3
  Filled 2022-03-24: qty 24, 28d supply, fill #4

## 2021-11-25 ENCOUNTER — Other Ambulatory Visit (HOSPITAL_COMMUNITY): Payer: Self-pay

## 2021-11-25 ENCOUNTER — Other Ambulatory Visit (HOSPITAL_BASED_OUTPATIENT_CLINIC_OR_DEPARTMENT_OTHER): Payer: Self-pay

## 2021-11-26 ENCOUNTER — Other Ambulatory Visit (HOSPITAL_COMMUNITY): Payer: Self-pay

## 2021-12-16 ENCOUNTER — Other Ambulatory Visit (HOSPITAL_COMMUNITY): Payer: Self-pay

## 2021-12-25 ENCOUNTER — Other Ambulatory Visit (HOSPITAL_BASED_OUTPATIENT_CLINIC_OR_DEPARTMENT_OTHER): Payer: Self-pay

## 2022-01-09 ENCOUNTER — Other Ambulatory Visit (HOSPITAL_COMMUNITY): Payer: Self-pay

## 2022-01-15 ENCOUNTER — Other Ambulatory Visit (HOSPITAL_COMMUNITY): Payer: Self-pay

## 2022-01-19 ENCOUNTER — Other Ambulatory Visit (HOSPITAL_COMMUNITY): Payer: Self-pay

## 2022-01-19 ENCOUNTER — Other Ambulatory Visit (HOSPITAL_BASED_OUTPATIENT_CLINIC_OR_DEPARTMENT_OTHER): Payer: Self-pay

## 2022-01-19 DIAGNOSIS — L400 Psoriasis vulgaris: Secondary | ICD-10-CM | POA: Diagnosis not present

## 2022-01-19 DIAGNOSIS — Z79899 Other long term (current) drug therapy: Secondary | ICD-10-CM | POA: Diagnosis not present

## 2022-01-19 MED ORDER — FOLIC ACID 1 MG PO TABS
1.0000 mg | ORAL_TABLET | Freq: Every day | ORAL | 2 refills | Status: DC
  Filled 2022-01-19: qty 30, 30d supply, fill #0
  Filled 2022-03-24: qty 30, 30d supply, fill #1
  Filled 2022-03-31 – 2022-05-31 (×2): qty 30, 30d supply, fill #2

## 2022-01-21 ENCOUNTER — Other Ambulatory Visit (HOSPITAL_BASED_OUTPATIENT_CLINIC_OR_DEPARTMENT_OTHER): Payer: Self-pay

## 2022-01-28 ENCOUNTER — Other Ambulatory Visit (HOSPITAL_COMMUNITY): Payer: Self-pay

## 2022-02-19 ENCOUNTER — Other Ambulatory Visit (HOSPITAL_BASED_OUTPATIENT_CLINIC_OR_DEPARTMENT_OTHER): Payer: Self-pay

## 2022-02-24 ENCOUNTER — Other Ambulatory Visit (HOSPITAL_COMMUNITY): Payer: Self-pay

## 2022-03-04 ENCOUNTER — Other Ambulatory Visit (HOSPITAL_COMMUNITY): Payer: Self-pay

## 2022-03-24 ENCOUNTER — Other Ambulatory Visit (HOSPITAL_BASED_OUTPATIENT_CLINIC_OR_DEPARTMENT_OTHER): Payer: Self-pay

## 2022-03-31 ENCOUNTER — Other Ambulatory Visit (HOSPITAL_COMMUNITY): Payer: Self-pay

## 2022-04-01 ENCOUNTER — Other Ambulatory Visit (HOSPITAL_COMMUNITY): Payer: Self-pay

## 2022-04-02 ENCOUNTER — Other Ambulatory Visit (HOSPITAL_COMMUNITY): Payer: Self-pay

## 2022-04-23 ENCOUNTER — Other Ambulatory Visit (HOSPITAL_COMMUNITY): Payer: Self-pay

## 2022-05-04 ENCOUNTER — Other Ambulatory Visit (HOSPITAL_COMMUNITY): Payer: Self-pay

## 2022-05-11 ENCOUNTER — Other Ambulatory Visit (HOSPITAL_BASED_OUTPATIENT_CLINIC_OR_DEPARTMENT_OTHER): Payer: Self-pay

## 2022-05-11 MED ORDER — METHOTREXATE SODIUM 2.5 MG PO TABS
15.0000 mg | ORAL_TABLET | ORAL | 6 refills | Status: DC
  Filled 2022-05-11: qty 24, 28d supply, fill #0
  Filled 2022-06-08: qty 24, 28d supply, fill #1
  Filled 2022-07-06: qty 24, 28d supply, fill #2
  Filled 2022-08-14: qty 24, 28d supply, fill #3
  Filled 2022-09-09: qty 24, 28d supply, fill #4
  Filled 2022-10-12: qty 24, 28d supply, fill #5
  Filled 2022-11-11: qty 24, 28d supply, fill #6

## 2022-05-21 ENCOUNTER — Other Ambulatory Visit (HOSPITAL_COMMUNITY): Payer: Self-pay

## 2022-05-22 DIAGNOSIS — L400 Psoriasis vulgaris: Secondary | ICD-10-CM | POA: Diagnosis not present

## 2022-05-22 DIAGNOSIS — Z79899 Other long term (current) drug therapy: Secondary | ICD-10-CM | POA: Diagnosis not present

## 2022-05-26 ENCOUNTER — Encounter: Payer: Self-pay | Admitting: Neurology

## 2022-05-26 ENCOUNTER — Telehealth: Payer: Self-pay | Admitting: Neurology

## 2022-05-26 ENCOUNTER — Other Ambulatory Visit (HOSPITAL_BASED_OUTPATIENT_CLINIC_OR_DEPARTMENT_OTHER): Payer: Self-pay

## 2022-05-26 ENCOUNTER — Ambulatory Visit (INDEPENDENT_AMBULATORY_CARE_PROVIDER_SITE_OTHER): Payer: 59 | Admitting: Neurology

## 2022-05-26 VITALS — BP 147/78 | HR 68 | Ht 73.0 in | Wt 233.0 lb

## 2022-05-26 DIAGNOSIS — G35 Multiple sclerosis: Secondary | ICD-10-CM | POA: Diagnosis not present

## 2022-05-26 MED ORDER — INTERFERON BETA-1A 30 MCG/0.5ML IM PSKT
PREFILLED_SYRINGE | INTRAMUSCULAR | 11 refills | Status: DC
  Filled 2022-05-26: qty 1, fill #0

## 2022-05-26 NOTE — Telephone Encounter (Signed)
Pt scheduled for MR cervical spine w/wo contrast and MR brain w/wo contrast at [facility] for 06/09/22 at 1:00pm  [facility] NPR

## 2022-05-26 NOTE — Progress Notes (Signed)
PATIENT: Dennis Leonard DOB: 1966-03-23  REASON FOR VISIT: follow up for MS HISTORY FROM: patient  Primary Neurologist: Dr. Marjory Lies since Dr. Anne Hahn retired  HISTORY OF PRESENT ILLNESS: Today 05/26/22 Feels MS doing remarkably well. Remains on the Avonex. At some point he may want to come off Avonex. He really doesn't want to change medications. He does experience flu like symptoms the day after, but manages with Tylenol and Ibuprofen. Rarely his left leg drags. He feels he is the same as he was 15 years ago. MRI imaging ordered last visit, didn't have it done.   Update 06/18/21 SS: Dennis Leonard here today for follow-up with history of RRMS on Avonex.  Has remained stable.  He works as a Education administrator.  He may notice some difficulty with word finding, or occasionally drag his left leg.  Otherwise no issues.  Has been stable for about 15 years.  MRIs were ordered last year, but were not completed.  No new issues. His wife is an NP, she does his injections, has tolerated well.  Here today unaccompanied.  Update 06/18/2020 SS: Dennis Leonard is a 56 year old male with history of relapsing remitting multiple sclerosis.  He remains on Avonex.  He works as a Education administrator.  Last MRI of the brain was in 2018, no change from 2010.  MRI of cervical spine showed mild multilevel degenerative disc changes, no significant stenosis (patchy cord signal at C2, C3, and C4).  For MS, he may occasionally drag his left leg or have word finding difficulties, as been chronic.  Is overall stable.  Wishes he were more active. His wife is an NP, they have 2 kids (10 and 14).  Tolerates Avonex well.  Does wonder about coming off the medication.  Presents today for evaluation unaccompanied.  HISTORY 06/19/2019 SS: Dennis Leonard is a 56 year old male with history of relapsing remitting multiple sclerosis.  He remains on Avonex.  Last MRI of the brain was in 2018, no change from 2010.  MRI of the cervical spine showed mild multilevel degenerative  disc changes, no significant stenosis.  He works as a Education administrator.  He indicates his symptoms have been stable since initial diagnosis.  He may occasionally drag his left leg, or have word finding difficulty, otherwise no overt symptoms.  He has not had any falls. The symptoms are not constant.  He denies any new numbness or weakness to his arms or legs, changes to his vision, or changes to his bowels or bladder.  He indicates his overall health has been well.  He presents today for evaluation unaccompanied.   REVIEW OF SYSTEMS: Out of a complete 14 system review of symptoms, the patient complains only of the following symptoms, and all other reviewed systems are negative.  See HPI  ALLERGIES: No Known Allergies  HOME MEDICATIONS: Outpatient Medications Prior to Visit  Medication Sig Dispense Refill   folic acid (FOLVITE) 1 MG tablet Take 1 tablet by mouth once a day 30 tablet 2   methotrexate (RHEUMATREX) 2.5 MG tablet Take 6 tablets by mouth once a week as directed 24 tablet 11   methotrexate (RHEUMATREX) 2.5 MG tablet Take 6 tablets (15 mg total) by mouth once a week. 24 tablet 6   interferon beta-1a (AVONEX) 30 MCG/0.5ML PSKT injection INJECT 0.5 MLS INTO THE MUSCLE EVERY 7 DAYS. 1 each 11   folic acid (FOLVITE) 1 MG tablet Take 1 tablet by mouth once a day 30 tablet 2   methotrexate (RHEUMATREX) 2.5 MG tablet  Take 8 tablets by mouth one day per week as directed 32 tablet 5   No facility-administered medications prior to visit.    PAST MEDICAL HISTORY: Past Medical History:  Diagnosis Date   History of chickenpox    Low back pain    Multiple sclerosis, relapsing-remitting (HCC) 07/23/2006 dx    PAST SURGICAL HISTORY: Past Surgical History:  Procedure Laterality Date   APPENDECTOMY  1994   TONSILLECTOMY  1974    FAMILY HISTORY: Family History  Problem Relation Age of Onset   Breast cancer Mother 29   Hyperlipidemia Mother    Alcohol abuse Maternal Uncle    Lung cancer Maternal  Aunt 40   Birth defects Sister     SOCIAL HISTORY: Social History   Socioeconomic History   Marital status: Married    Spouse name: Not on file   Number of children: 2   Years of education: COLLEGE   Highest education level: Not on file  Occupational History   Occupation: JUDGE  Tobacco Use   Smoking status: Former    Types: Cigarettes    Quit date: 06/22/2004    Years since quitting: 17.9   Smokeless tobacco: Never   Tobacco comments:    JD, Optometrist. 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   Patient is right handed.   Social Determinants of Health   Financial Resource Strain: Not on file  Food Insecurity: Not on file  Transportation Needs: Not on file  Physical Activity: Not on file  Stress: Not on file  Social Connections: Not on file  Intimate Partner Violence: Not on file   PHYSICAL EXAM  Vitals:   05/26/22 1319  BP: (!) 147/78  Pulse: 68  Weight: 233 lb (105.7 kg)  Height: 6\' 1"  (1.854 m)    Body mass index is 30.74 kg/m.  Generalized: Well developed, in no acute distress  Neurological examination  Mentation: Alert oriented to time, place, history taking. Follows all commands speech and language fluent Cranial nerve II-XII: Pupils were equal round reactive to light. Extraocular movements were full, visual field were full on confrontational test. Facial sensation and strength were normal.  Head turning and shoulder shrug  were normal and symmetric. Motor: The motor testing reveals 5 over 5 strength of all 4 extremities. Good symmetric motor tone is noted throughout.  Sensory: Sensory testing is intact to soft touch on all 4 extremities. No evidence of extinction is noted.  Coordination: Cerebellar testing reveals  good finger-nose-finger and heel-to-shin bilaterally.  Gait and station: Gait is normal. Tandem gait is normal.  Reflexes: Deep tendon reflexes are symmetric but slightly increased throughout.   DIAGNOSTIC DATA (LABS, IMAGING, TESTING) - I reviewed patient records, labs, notes, testing and imaging myself where available.  Lab Results  Component Value Date   WBC 4.5 06/18/2020   HGB 16.6 06/18/2020   HCT 47.5 06/18/2020   MCV 97 06/18/2020   PLT 310 06/18/2020      Component Value Date/Time   NA 142 06/18/2020 1138   K 4.8 06/18/2020 1138   CL 105 06/18/2020 1138   CO2 26 06/18/2020 1138   GLUCOSE 85 06/18/2020 1138   GLUCOSE 94 01/13/2019 0902  BUN 10 06/18/2020 1138   CREATININE 1.06 06/18/2020 1138   CALCIUM 9.4 06/18/2020 1138   PROT 7.0 06/18/2020 1138   ALBUMIN 4.4 06/18/2020 1138   AST 22 06/18/2020 1138   ALT 29 06/18/2020 1138   ALKPHOS 50 06/18/2020 1138   BILITOT 0.3 06/18/2020 1138   GFRNONAA 80 06/18/2020 1138   GFRAA 93 06/18/2020 1138   Lab Results  Component Value Date   CHOL 161 01/13/2019   HDL 36.00 (L) 01/13/2019   LDLDIRECT 81.0 01/13/2019   TRIG 344.0 (H) 01/13/2019   CHOLHDL 4 01/13/2019   Lab Results  Component Value Date   HGBA1C 5.1 01/13/2019   No results found for: "VITAMINB12" Lab Results  Component Value Date   TSH 1.57 08/25/2011   ASSESSMENT AND PLAN 56 y.o. year old male  has a past medical history of History of chickenpox, Low back pain, and Multiple sclerosis, relapsing-remitting (HCC) (07/23/2006 dx). here with:  1.  Relapsing remitting multiple sclerosis -Continue Avonex, he remains overall stable -Check CBC, CMP -Check MRI of the brain and cervical spine with and without contrast for MS surveillance -Follow-up in 1 year or sooner if needed  Otila Kluver, DNP 05/26/2022, 2:00 PM [facility] [address], [address] [place], [place] [address] [phone]

## 2022-05-27 LAB — CBC WITH DIFFERENTIAL/PLATELET
Basophils Absolute: 0 10*3/uL (ref 0.0–0.2)
Basos: 0 %
EOS (ABSOLUTE): 0 10*3/uL (ref 0.0–0.4)
Eos: 1 %
Hematocrit: 46.9 % (ref 37.5–51.0)
Hemoglobin: 16.3 g/dL (ref 13.0–17.7)
Immature Grans (Abs): 0 10*3/uL (ref 0.0–0.1)
Immature Granulocytes: 1 %
Lymphocytes Absolute: 1.6 10*3/uL (ref 0.7–3.1)
Lymphs: 33 %
MCH: 33.2 pg — ABNORMAL HIGH (ref 26.6–33.0)
MCHC: 34.8 g/dL (ref 31.5–35.7)
MCV: 96 fL (ref 79–97)
Monocytes Absolute: 0.7 10*3/uL (ref 0.1–0.9)
Monocytes: 14 %
Neutrophils Absolute: 2.5 10*3/uL (ref 1.4–7.0)
Neutrophils: 51 %
Platelets: 319 10*3/uL (ref 150–450)
RBC: 4.91 x10E6/uL (ref 4.14–5.80)
RDW: 13.1 % (ref 11.6–15.4)
WBC: 4.8 10*3/uL (ref 3.4–10.8)

## 2022-05-27 LAB — COMPREHENSIVE METABOLIC PANEL
ALT: 50 IU/L — ABNORMAL HIGH (ref 0–44)
AST: 31 IU/L (ref 0–40)
Albumin/Globulin Ratio: 1.9 (ref 1.2–2.2)
Albumin: 4.6 g/dL (ref 3.8–4.9)
Alkaline Phosphatase: 51 IU/L (ref 44–121)
BUN/Creatinine Ratio: 12 (ref 9–20)
BUN: 12 mg/dL (ref 6–24)
Bilirubin Total: 0.4 mg/dL (ref 0.0–1.2)
CO2: 26 mmol/L (ref 20–29)
Calcium: 9.5 mg/dL (ref 8.7–10.2)
Chloride: 104 mmol/L (ref 96–106)
Creatinine, Ser: 0.99 mg/dL (ref 0.76–1.27)
Globulin, Total: 2.4 g/dL (ref 1.5–4.5)
Glucose: 81 mg/dL (ref 70–99)
Potassium: 4.5 mmol/L (ref 3.5–5.2)
Sodium: 144 mmol/L (ref 134–144)
Total Protein: 7 g/dL (ref 6.0–8.5)
eGFR: 91 mL/min/{1.73_m2} (ref >59)

## 2022-05-31 ENCOUNTER — Other Ambulatory Visit (HOSPITAL_BASED_OUTPATIENT_CLINIC_OR_DEPARTMENT_OTHER): Payer: Self-pay

## 2022-06-08 ENCOUNTER — Other Ambulatory Visit (HOSPITAL_BASED_OUTPATIENT_CLINIC_OR_DEPARTMENT_OTHER): Payer: Self-pay

## 2022-06-09 ENCOUNTER — Ambulatory Visit: Payer: 59

## 2022-06-09 DIAGNOSIS — G35 Multiple sclerosis: Secondary | ICD-10-CM | POA: Diagnosis not present

## 2022-06-09 MED ORDER — GADOBENATE DIMEGLUMINE 529 MG/ML IV SOLN
20.0000 mL | Freq: Once | INTRAVENOUS | Status: AC | PRN
  Administered 2022-06-09: 20 mL via INTRAVENOUS

## 2022-06-18 ENCOUNTER — Ambulatory Visit: Payer: 59 | Admitting: Neurology

## 2022-06-23 ENCOUNTER — Other Ambulatory Visit (HOSPITAL_COMMUNITY): Payer: Self-pay

## 2022-06-24 ENCOUNTER — Other Ambulatory Visit (HOSPITAL_COMMUNITY): Payer: Self-pay

## 2022-06-26 ENCOUNTER — Other Ambulatory Visit (HOSPITAL_COMMUNITY): Payer: Self-pay

## 2022-06-29 ENCOUNTER — Other Ambulatory Visit (HOSPITAL_COMMUNITY): Payer: Self-pay

## 2022-06-29 ENCOUNTER — Other Ambulatory Visit: Payer: Self-pay | Admitting: Pharmacist

## 2022-06-29 MED ORDER — INTERFERON BETA-1A 30 MCG/0.5ML IM PSKT
PREFILLED_SYRINGE | INTRAMUSCULAR | 11 refills | Status: DC
  Filled 2022-06-29: qty 1, 28d supply, fill #0
  Filled 2022-07-21: qty 1, 28d supply, fill #1
  Filled 2022-08-19 – 2022-09-10 (×3): qty 1, 28d supply, fill #2
  Filled 2022-10-18 – 2022-10-19 (×2): qty 1, 28d supply, fill #3
  Filled 2022-11-17: qty 1, 28d supply, fill #4
  Filled 2022-12-08: qty 1, 28d supply, fill #5
  Filled 2023-02-10: qty 1, 28d supply, fill #6
  Filled 2023-04-07: qty 1, 28d supply, fill #7
  Filled 2023-04-30: qty 1, 28d supply, fill #8
  Filled 2023-06-14: qty 1, 28d supply, fill #9

## 2022-06-30 ENCOUNTER — Other Ambulatory Visit (HOSPITAL_COMMUNITY): Payer: Self-pay

## 2022-07-06 ENCOUNTER — Other Ambulatory Visit (HOSPITAL_BASED_OUTPATIENT_CLINIC_OR_DEPARTMENT_OTHER): Payer: Self-pay

## 2022-07-09 ENCOUNTER — Telehealth: Payer: Self-pay | Admitting: Family Medicine

## 2022-07-09 NOTE — Telephone Encounter (Signed)
error 

## 2022-07-21 ENCOUNTER — Other Ambulatory Visit (HOSPITAL_COMMUNITY): Payer: Self-pay

## 2022-07-22 ENCOUNTER — Other Ambulatory Visit (HOSPITAL_BASED_OUTPATIENT_CLINIC_OR_DEPARTMENT_OTHER): Payer: Self-pay

## 2022-07-23 ENCOUNTER — Other Ambulatory Visit (HOSPITAL_BASED_OUTPATIENT_CLINIC_OR_DEPARTMENT_OTHER): Payer: Self-pay

## 2022-07-23 MED ORDER — FOLIC ACID 1 MG PO TABS
1.0000 mg | ORAL_TABLET | Freq: Every day | ORAL | 10 refills | Status: DC
  Filled 2022-07-23: qty 90, 90d supply, fill #0
  Filled 2023-01-05: qty 90, 90d supply, fill #1
  Filled 2023-04-06: qty 90, 90d supply, fill #2

## 2022-07-24 ENCOUNTER — Other Ambulatory Visit (HOSPITAL_BASED_OUTPATIENT_CLINIC_OR_DEPARTMENT_OTHER): Payer: Self-pay

## 2022-07-24 ENCOUNTER — Other Ambulatory Visit (HOSPITAL_COMMUNITY): Payer: Self-pay

## 2022-08-06 ENCOUNTER — Other Ambulatory Visit (HOSPITAL_COMMUNITY): Payer: Self-pay

## 2022-08-10 ENCOUNTER — Ambulatory Visit: Payer: 59 | Admitting: Family Medicine

## 2022-08-10 ENCOUNTER — Encounter: Payer: Self-pay | Admitting: Family Medicine

## 2022-08-10 VITALS — BP 112/72 | HR 60 | Temp 98.9°F | Ht 73.0 in | Wt 224.0 lb

## 2022-08-10 DIAGNOSIS — Z23 Encounter for immunization: Secondary | ICD-10-CM

## 2022-08-10 DIAGNOSIS — Z7689 Persons encountering health services in other specified circumstances: Secondary | ICD-10-CM

## 2022-08-10 DIAGNOSIS — G35 Multiple sclerosis: Secondary | ICD-10-CM

## 2022-08-10 DIAGNOSIS — Z1211 Encounter for screening for malignant neoplasm of colon: Secondary | ICD-10-CM

## 2022-08-10 NOTE — Assessment & Plan Note (Signed)
He reports he has been well. Last seen [facility] Neurology in December. He takes Avonex weekly, mostly on Saturdays and mid week he takes Methotrexate for psoriasis that his dermatology prescribes. No concerns. In December, he had an elevated ALT of 50. Will reassess at his physical in May. Five years ago, he had a mild elevation with it going back to normal limits. He has a follow up in 1 year with neurology.

## 2022-08-10 NOTE — Progress Notes (Signed)
New Patient Office Visit  Subjective    Patient ID: Dennis Leonard, male    DOB: [birth year]  Age: 57 y.o. MRN: [id number]  CC:  Chief Complaint  Patient presents with   Establish Care    No concerns for today    HPI Dennis Leonard presents to establish care with provider. He has past medical history of multiple sclerosis. He currently is under the care of Butler Denmark, NP with [facility]. His last appointment was 05/26/2022 with a one year follow up. He had CBC and CMP at that visit. ALT was mildly elevated and will need to be checked at his physical. Also, has had a repeat MRI of cervical spine and brain. Under the care of dermatology with Dr. Sharen Hint with [facility].   He reports he has had the initial two [place] Covid vaccines with no booster. He is open to wanting the Shingrix vaccine but would like to wait til mid week with him usually taking Avonex on Saturday. With him being off today, he choose to take it yesterday instead. He would like a colonoscopy and influenza vaccine today. Tetanus vaccine is up to date  He has no concerns today.    Outpatient Encounter Medications as of 08/10/2022  Medication Sig   folic acid (FOLVITE) 1 MG tablet Take 1 tablet by mouth once a day   interferon beta-1a (AVONEX) 30 MCG/0.5ML PSKT injection INJECT 0.5 MLS INTO THE MUSCLE EVERY 7 DAYS.   methotrexate (RHEUMATREX) 2.5 MG tablet Take 6 tablets (15 mg total) by mouth once a week.   [DISCONTINUED] methotrexate (RHEUMATREX) 2.5 MG tablet Take 6 tablets by mouth once a week as directed   No facility-administered encounter medications on file as of 08/10/2022.    Past Medical History:  Diagnosis Date   History of chickenpox    Low back pain    Multiple sclerosis, relapsing-remitting ([place]) 07/23/2006 dx    Past Surgical History:  Procedure Laterality Date   APPENDECTOMY  1994   TONSILLECTOMY  1974    Family History  Problem Relation Age of Onset   Breast  cancer Mother 71   Hyperlipidemia Mother    Alcohol abuse Maternal Uncle    Lung cancer Maternal Aunt 75   Birth defects Sister     Social History   Socioeconomic History   Marital status: Married    Spouse name: Not on file   Number of children: 2   Years of education: COLLEGE   Highest education level: Not on file  Occupational History   Occupation: JUDGE  Tobacco Use   Smoking status: Former    Types: Cigarettes    Quit date: 06/22/2004    Years since quitting: 18.1   Smokeless tobacco: Never   Tobacco comments:    JD, Nurse, mental health. 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   Patient is right handed.   Social Determinants of Health   Financial Resource Strain: Not on file  Food Insecurity: Not on file  Transportation Needs: Not on file  Physical Activity: Not on file  Stress: Not on file  Social Connections: Not on file  Intimate Partner Violence: Not on file  ROS See HPI above     Objective    BP 112/72   Pulse 60   Temp 98.9 F (37.2 C)   Ht 6' 1"$  (1.854 m)   Wt 224 lb (101.6 kg)   SpO2 98%   BMI 29.55 kg/m   Physical Exam Constitutional:      General: He is not in acute distress.    Appearance: Normal appearance. He is not ill-appearing.  HENT:     Head: Normocephalic and atraumatic.  Eyes:     Comments: Wearing glasses   Cardiovascular:     Rate and Rhythm: Normal rate and regular rhythm.     Heart sounds: Normal heart sounds. No murmur heard.    No friction rub. No gallop.  Pulmonary:     Effort: Pulmonary effort is normal. No respiratory distress.     Breath sounds: Normal breath sounds.  Musculoskeletal:        General: Normal range of motion.     Right lower leg: No edema.     Left lower leg: No  edema.  Skin:    General: Skin is warm and dry.  Neurological:     General: No focal deficit present.     Mental Status: He is alert and oriented to person, place, and time.  Psychiatric:        Mood and Affect: Mood normal.        Behavior: Behavior normal.        Thought Content: Thought content normal.        Judgment: Judgment normal.      Assessment & Plan:  Influenza vaccine administered today. He reports he usually does well with taking vaccine.  Follow up for a nurse visit during the middle of the week for his first Shingrix vaccine. Will need second vaccine in 2-6 months.  Referral made for routine colonoscopy. Follow up in 3 months for physical. Will do baseline screening labs at that visit.   There are no diagnoses linked to this encounter.  No follow-ups on file.   Valarie Merino, NP

## 2022-08-10 NOTE — Patient Instructions (Addendum)
It was a pleasure meeting you and I look forward to taking care of you! Influenza administered today.  Will make a nurse appointment for shingles vaccine Follow up in 3 months for physical exam. Referral placed for routine colonoscopy.  Take Care!

## 2022-08-13 ENCOUNTER — Other Ambulatory Visit (HOSPITAL_COMMUNITY): Payer: Self-pay

## 2022-08-14 ENCOUNTER — Other Ambulatory Visit (HOSPITAL_BASED_OUTPATIENT_CLINIC_OR_DEPARTMENT_OTHER): Payer: Self-pay

## 2022-08-14 ENCOUNTER — Other Ambulatory Visit (HOSPITAL_COMMUNITY): Payer: Self-pay

## 2022-08-15 ENCOUNTER — Other Ambulatory Visit (HOSPITAL_BASED_OUTPATIENT_CLINIC_OR_DEPARTMENT_OTHER): Payer: Self-pay

## 2022-08-17 ENCOUNTER — Telehealth: Payer: Self-pay

## 2022-08-17 ENCOUNTER — Other Ambulatory Visit (HOSPITAL_COMMUNITY): Payer: Self-pay

## 2022-08-17 NOTE — Telephone Encounter (Signed)
Pharmacy Patient Advocate Encounter  Prior Authorization for Avonex Pen 30MCG/0.5ML auto-injector kit has been approved.    PA# PA Case ID: [id number] Effective dates: 08/18/2023 through 08/18/2023      Pharmacy Patient Advocate Encounter   Received notification from [facility] that prior authorization for Avonex Pen 30 MCG/0.5ML Auto-injector kit is required/requested.   PA submitted on 08/17/2022 to (ins) [employer] via CoverMyMeds Key [id number] Status is pending

## 2022-08-18 ENCOUNTER — Other Ambulatory Visit (HOSPITAL_COMMUNITY): Payer: Self-pay

## 2022-08-19 ENCOUNTER — Other Ambulatory Visit (HOSPITAL_COMMUNITY): Payer: Self-pay

## 2022-08-19 ENCOUNTER — Ambulatory Visit: Payer: 59

## 2022-08-21 ENCOUNTER — Other Ambulatory Visit (HOSPITAL_COMMUNITY): Payer: Self-pay

## 2022-08-24 ENCOUNTER — Other Ambulatory Visit: Payer: Self-pay

## 2022-08-24 ENCOUNTER — Other Ambulatory Visit (HOSPITAL_COMMUNITY): Payer: Self-pay

## 2022-08-25 ENCOUNTER — Other Ambulatory Visit (HOSPITAL_COMMUNITY): Payer: Self-pay

## 2022-08-27 ENCOUNTER — Ambulatory Visit (INDEPENDENT_AMBULATORY_CARE_PROVIDER_SITE_OTHER): Payer: 59

## 2022-08-27 ENCOUNTER — Other Ambulatory Visit (HOSPITAL_COMMUNITY): Payer: Self-pay

## 2022-08-27 DIAGNOSIS — Z23 Encounter for immunization: Secondary | ICD-10-CM

## 2022-08-27 NOTE — Progress Notes (Signed)
Pt came in for his first shingles vaccine . Tolerated injection well . Advised to return in 6 months for the second shingles vaccine

## 2022-08-31 ENCOUNTER — Other Ambulatory Visit (HOSPITAL_COMMUNITY): Payer: Self-pay

## 2022-09-02 ENCOUNTER — Other Ambulatory Visit (HOSPITAL_COMMUNITY): Payer: Self-pay

## 2022-09-03 ENCOUNTER — Other Ambulatory Visit (HOSPITAL_COMMUNITY): Payer: Self-pay

## 2022-09-04 ENCOUNTER — Other Ambulatory Visit: Payer: Self-pay

## 2022-09-07 ENCOUNTER — Other Ambulatory Visit: Payer: Self-pay

## 2022-09-07 ENCOUNTER — Ambulatory Visit: Payer: 59 | Attending: Internal Medicine | Admitting: Pharmacist

## 2022-09-07 DIAGNOSIS — Z79899 Other long term (current) drug therapy: Secondary | ICD-10-CM

## 2022-09-07 NOTE — Progress Notes (Signed)
S: Patient is currently taking Avonex for multiple sclerosis. Patient is managed by NP Butler Denmark for this.   Adherence: no missed doses.   Efficacy: medication continues to work well for him. MR from Dec, 2023 showed no new lesions compared to 09/07/2016.   Current adverse effects:  Labs: WNL. Of note, mild ALT elevation that is being monitored. No contraindication exists to therapy at this time.  Flu-like symptoms: none Neuropsychiatric side effects: none  O:  Lab Results  Component Value Date   WBC 4.8 05/26/2022   HGB 16.3 05/26/2022   HCT 46.9 05/26/2022   MCV 96 05/26/2022   PLT 319 05/26/2022      Chemistry      Component Value Date/Time   NA 144 05/26/2022 1401   K 4.5 05/26/2022 1401   CL 104 05/26/2022 1401   CO2 26 05/26/2022 1401   BUN 12 05/26/2022 1401   CREATININE 0.99 05/26/2022 1401      Component Value Date/Time   CALCIUM 9.5 05/26/2022 1401   ALKPHOS 51 05/26/2022 1401   AST 31 05/26/2022 1401   ALT 50 (H) 05/26/2022 1401   BILITOT 0.4 05/26/2022 1401      A/P: 1. Medication review: patient continues to tolerate and is stable on Avonex with no adverse effects. Disease is stable per neuro.  Reviewed Avonex, including injection technique and adverse effects. No recommendations for any changes at this time.  Benard Halsted, PharmD, Para [place], [place] [phone]

## 2022-09-09 ENCOUNTER — Other Ambulatory Visit (HOSPITAL_BASED_OUTPATIENT_CLINIC_OR_DEPARTMENT_OTHER): Payer: Self-pay

## 2022-09-10 ENCOUNTER — Other Ambulatory Visit (HOSPITAL_COMMUNITY): Payer: Self-pay

## 2022-09-10 ENCOUNTER — Other Ambulatory Visit: Payer: Self-pay

## 2022-09-11 ENCOUNTER — Other Ambulatory Visit (HOSPITAL_COMMUNITY): Payer: Self-pay

## 2022-09-14 ENCOUNTER — Other Ambulatory Visit (HOSPITAL_COMMUNITY): Payer: Self-pay

## 2022-09-15 ENCOUNTER — Other Ambulatory Visit (HOSPITAL_COMMUNITY): Payer: Self-pay

## 2022-09-15 ENCOUNTER — Other Ambulatory Visit: Payer: Self-pay

## 2022-09-17 ENCOUNTER — Other Ambulatory Visit (HOSPITAL_COMMUNITY): Payer: Self-pay

## 2022-09-18 ENCOUNTER — Other Ambulatory Visit: Payer: Self-pay

## 2022-09-18 ENCOUNTER — Other Ambulatory Visit (HOSPITAL_COMMUNITY): Payer: Self-pay

## 2022-09-21 ENCOUNTER — Other Ambulatory Visit: Payer: Self-pay

## 2022-09-21 DIAGNOSIS — Z79899 Other long term (current) drug therapy: Secondary | ICD-10-CM | POA: Diagnosis not present

## 2022-09-21 DIAGNOSIS — L400 Psoriasis vulgaris: Secondary | ICD-10-CM | POA: Diagnosis not present

## 2022-09-24 ENCOUNTER — Other Ambulatory Visit (HOSPITAL_BASED_OUTPATIENT_CLINIC_OR_DEPARTMENT_OTHER): Payer: Self-pay

## 2022-09-24 ENCOUNTER — Other Ambulatory Visit (HOSPITAL_COMMUNITY): Payer: Self-pay

## 2022-10-08 ENCOUNTER — Other Ambulatory Visit (HOSPITAL_COMMUNITY): Payer: Self-pay

## 2022-10-13 ENCOUNTER — Other Ambulatory Visit: Payer: Self-pay

## 2022-10-13 ENCOUNTER — Other Ambulatory Visit (HOSPITAL_BASED_OUTPATIENT_CLINIC_OR_DEPARTMENT_OTHER): Payer: Self-pay

## 2022-10-19 ENCOUNTER — Other Ambulatory Visit (HOSPITAL_COMMUNITY): Payer: Self-pay

## 2022-10-20 ENCOUNTER — Other Ambulatory Visit (HOSPITAL_COMMUNITY): Payer: Self-pay

## 2022-11-10 NOTE — Progress Notes (Unsigned)
   Complete physical exam  Patient: Dennis Leonard   DOB: [birth year]   57 y.o. Male  MRN: [id number]  Subjective:    No chief complaint on file.   Dennis Leonard is a 57 y.o. male who presents today for a complete physical exam. He reports consuming a {diet types:17450} diet. {types:19826} He generally feels {DESC; WELL/FAIRLY WELL/POORLY:18703}. He reports sleeping {DESC; WELL/FAIRLY WELL/POORLY:18703}. He {does/does not:200015} have additional problems to discuss today.    Most recent fall risk assessment:    08/10/2022    9:55 AM  Fall Risk   Falls in the past year? 0  Risk for fall due to : No Fall Risks  Follow up Falls evaluation completed     Most recent depression screenings:    08/10/2022    9:56 AM 08/10/2022    9:52 AM  PHQ 2/9 Scores  PHQ - 2 Score 0 0  PHQ- 9 Score 0 0    {VISON DENTAL STD PSA (Optional):27386}  {History (Optional):23778}  Patient Care Team: Alveria Apley, NP as PCP - General (Family Medicine) Glean Salvo, NP as Nurse Practitioner (Neurology) Aris Lot, MD as Consulting Physician (Dermatology)   Outpatient Medications Prior to Visit  Medication Sig   folic acid (FOLVITE) 1 MG tablet Take 1 tablet by mouth once a day   interferon beta-1a (AVONEX) 30 MCG/0.5ML PSKT injection INJECT 0.5 MLS INTO THE MUSCLE EVERY 7 DAYS.   methotrexate (RHEUMATREX) 2.5 MG tablet Take 6 tablets (15 mg total) by mouth once a week.   No facility-administered medications prior to visit.    ROS See HPI above       Objective:     There were no vitals taken for this visit. {Vitals History (Optional):23777}  Physical Exam   No results found for any visits on 11/11/22. {Show previous labs (optional):23779}    Assessment & Plan:    Routine Health Maintenance and Physical Exam  Immunization History  Administered Date(s) Administered   Influenza,inj,Quad PF,6+ Mos 04/03/2017, 07/01/2018, 03/31/2019, 05/09/2020, 08/10/2022   Tdap  07/30/2015   Zoster Recombinat (Shingrix) 08/27/2022    Health Maintenance  Topic Date Due   COVID-19 Vaccine (1) Never done   COLONOSCOPY (Pts 45-41yrs Insurance coverage will need to be confirmed)  Never done   Zoster Vaccines- Shingrix (2 of 2) 10/22/2022   Hepatitis C Screening  08/11/2023 (Originally 12/23/1985)   HIV Screening  08/11/2023 (Originally 12/24/1982)   INFLUENZA VACCINE  01/21/2023   DTaP/Tdap/Td (2 - Td or Tdap) 07/29/2025   HPV VACCINES  Aged Out    Discussed health benefits of physical activity, and encouraged him to engage in regular exercise appropriate for his age and condition.  Annual physical exam  Prostate cancer screening  Lipid screening  Thyroid disorder screening  Colon cancer screening  Need for shingles vaccine  Encounter for screening for HIV  Need for hepatitis C screening test    No follow-ups on file.    1.Review health maintenance: -Put a second referral to GI for colonoscopy.  -Ordered second Shingrix vaccine  -Declines covid booster  -HIV and Hep C screening  Zandra Abts, NP

## 2022-11-11 ENCOUNTER — Encounter: Payer: Self-pay | Admitting: Family Medicine

## 2022-11-11 ENCOUNTER — Ambulatory Visit (INDEPENDENT_AMBULATORY_CARE_PROVIDER_SITE_OTHER): Payer: 59 | Admitting: Family Medicine

## 2022-11-11 ENCOUNTER — Other Ambulatory Visit (HOSPITAL_BASED_OUTPATIENT_CLINIC_OR_DEPARTMENT_OTHER): Payer: Self-pay

## 2022-11-11 VITALS — BP 118/78 | HR 75 | Temp 98.7°F | Ht 73.0 in | Wt 228.1 lb

## 2022-11-11 DIAGNOSIS — Z125 Encounter for screening for malignant neoplasm of prostate: Secondary | ICD-10-CM | POA: Diagnosis not present

## 2022-11-11 DIAGNOSIS — Z1211 Encounter for screening for malignant neoplasm of colon: Secondary | ICD-10-CM

## 2022-11-11 DIAGNOSIS — Z13220 Encounter for screening for lipoid disorders: Secondary | ICD-10-CM | POA: Diagnosis not present

## 2022-11-11 DIAGNOSIS — Z1329 Encounter for screening for other suspected endocrine disorder: Secondary | ICD-10-CM | POA: Diagnosis not present

## 2022-11-11 DIAGNOSIS — Z1159 Encounter for screening for other viral diseases: Secondary | ICD-10-CM

## 2022-11-11 DIAGNOSIS — G35 Multiple sclerosis: Secondary | ICD-10-CM | POA: Diagnosis not present

## 2022-11-11 DIAGNOSIS — Z114 Encounter for screening for human immunodeficiency virus [HIV]: Secondary | ICD-10-CM

## 2022-11-11 DIAGNOSIS — Z0000 Encounter for general adult medical examination without abnormal findings: Secondary | ICD-10-CM

## 2022-11-11 DIAGNOSIS — Z23 Encounter for immunization: Secondary | ICD-10-CM

## 2022-11-11 NOTE — Patient Instructions (Addendum)
-  Physical performed with no concerns. -Placed a second referral to GI. Please call back to the office if you don't year about an appointment in 2 weeks.  -Follow up for a lab visit when fasting. -Follow up for yearly physical.

## 2022-11-13 ENCOUNTER — Other Ambulatory Visit (HOSPITAL_COMMUNITY): Payer: Self-pay

## 2022-11-17 ENCOUNTER — Other Ambulatory Visit (HOSPITAL_COMMUNITY): Payer: Self-pay

## 2022-11-17 ENCOUNTER — Other Ambulatory Visit: Payer: Self-pay

## 2022-11-18 ENCOUNTER — Other Ambulatory Visit: Payer: Self-pay

## 2022-11-18 ENCOUNTER — Other Ambulatory Visit (HOSPITAL_BASED_OUTPATIENT_CLINIC_OR_DEPARTMENT_OTHER): Payer: Self-pay

## 2022-12-08 ENCOUNTER — Other Ambulatory Visit (HOSPITAL_COMMUNITY): Payer: Self-pay

## 2022-12-11 ENCOUNTER — Other Ambulatory Visit: Payer: Self-pay

## 2022-12-11 ENCOUNTER — Other Ambulatory Visit (HOSPITAL_COMMUNITY): Payer: Self-pay

## 2022-12-16 ENCOUNTER — Other Ambulatory Visit (HOSPITAL_BASED_OUTPATIENT_CLINIC_OR_DEPARTMENT_OTHER): Payer: Self-pay

## 2022-12-17 ENCOUNTER — Other Ambulatory Visit (HOSPITAL_BASED_OUTPATIENT_CLINIC_OR_DEPARTMENT_OTHER): Payer: Self-pay

## 2022-12-17 MED ORDER — METHOTREXATE SODIUM 2.5 MG PO TABS
15.0000 mg | ORAL_TABLET | ORAL | 10 refills | Status: DC
  Filled 2022-12-17: qty 24, 28d supply, fill #0
  Filled 2023-01-11: qty 24, 28d supply, fill #1
  Filled 2023-02-10: qty 24, 28d supply, fill #2
  Filled 2023-03-14: qty 24, 28d supply, fill #3
  Filled 2023-04-05: qty 24, 28d supply, fill #4
  Filled 2023-05-03: qty 24, 28d supply, fill #5
  Filled 2023-06-03: qty 24, 28d supply, fill #6
  Filled 2023-07-05: qty 24, 28d supply, fill #7
  Filled 2023-07-31 (×2): qty 24, 28d supply, fill #8
  Filled 2023-08-29: qty 24, 28d supply, fill #9
  Filled 2023-09-29: qty 24, 28d supply, fill #10

## 2023-01-06 ENCOUNTER — Other Ambulatory Visit (HOSPITAL_COMMUNITY): Payer: Self-pay

## 2023-01-08 ENCOUNTER — Other Ambulatory Visit (HOSPITAL_COMMUNITY): Payer: Self-pay

## 2023-01-11 ENCOUNTER — Other Ambulatory Visit: Payer: Self-pay

## 2023-01-11 ENCOUNTER — Other Ambulatory Visit (HOSPITAL_COMMUNITY): Payer: Self-pay

## 2023-01-11 ENCOUNTER — Encounter (HOSPITAL_COMMUNITY): Payer: Self-pay

## 2023-01-13 ENCOUNTER — Other Ambulatory Visit (HOSPITAL_COMMUNITY): Payer: Self-pay

## 2023-01-18 ENCOUNTER — Other Ambulatory Visit: Payer: Self-pay

## 2023-01-22 ENCOUNTER — Other Ambulatory Visit: Payer: Self-pay

## 2023-01-25 DIAGNOSIS — L400 Psoriasis vulgaris: Secondary | ICD-10-CM | POA: Diagnosis not present

## 2023-01-25 DIAGNOSIS — Z79899 Other long term (current) drug therapy: Secondary | ICD-10-CM | POA: Diagnosis not present

## 2023-02-04 ENCOUNTER — Encounter (INDEPENDENT_AMBULATORY_CARE_PROVIDER_SITE_OTHER): Payer: Self-pay

## 2023-02-11 ENCOUNTER — Other Ambulatory Visit (HOSPITAL_COMMUNITY): Payer: Self-pay

## 2023-02-11 ENCOUNTER — Other Ambulatory Visit: Payer: Self-pay

## 2023-02-15 ENCOUNTER — Other Ambulatory Visit (HOSPITAL_COMMUNITY): Payer: Self-pay

## 2023-02-17 ENCOUNTER — Other Ambulatory Visit (HOSPITAL_COMMUNITY): Payer: Self-pay

## 2023-02-26 ENCOUNTER — Ambulatory Visit: Payer: 59

## 2023-03-02 ENCOUNTER — Other Ambulatory Visit (HOSPITAL_COMMUNITY): Payer: Self-pay

## 2023-03-04 ENCOUNTER — Other Ambulatory Visit (HOSPITAL_COMMUNITY): Payer: Self-pay

## 2023-03-05 ENCOUNTER — Ambulatory Visit: Payer: 59

## 2023-03-08 ENCOUNTER — Encounter (HOSPITAL_COMMUNITY): Payer: Self-pay

## 2023-03-08 ENCOUNTER — Other Ambulatory Visit (HOSPITAL_COMMUNITY): Payer: Self-pay

## 2023-03-11 ENCOUNTER — Other Ambulatory Visit: Payer: Self-pay

## 2023-03-13 ENCOUNTER — Encounter (HOSPITAL_COMMUNITY): Payer: Self-pay

## 2023-03-30 ENCOUNTER — Other Ambulatory Visit (HOSPITAL_COMMUNITY): Payer: Self-pay

## 2023-03-30 ENCOUNTER — Other Ambulatory Visit (HOSPITAL_BASED_OUTPATIENT_CLINIC_OR_DEPARTMENT_OTHER): Payer: Self-pay

## 2023-03-30 ENCOUNTER — Telehealth: Payer: Self-pay | Admitting: Neurology

## 2023-03-30 MED ORDER — METAXALONE 800 MG PO TABS
ORAL_TABLET | ORAL | 1 refills | Status: AC
  Filled 2023-03-30: qty 30, 30d supply, fill #0
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
## 2023-04-06 ENCOUNTER — Other Ambulatory Visit (HOSPITAL_BASED_OUTPATIENT_CLINIC_OR_DEPARTMENT_OTHER): Payer: Self-pay

## 2023-04-07 ENCOUNTER — Other Ambulatory Visit: Payer: Self-pay

## 2023-04-07 ENCOUNTER — Encounter (HOSPITAL_COMMUNITY): Payer: Self-pay
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
## 2023-04-07 NOTE — Progress Notes (Signed)
Specialty Pharmacy Refill Coordination Note  Dennis Leonard is a 57 y.o. male contacted today regarding refills of specialty medication(s) Interferon Beta-1a   Patient requested [name] at [facility] at [place] date: 04/12/23   Medication will be filled on 04/09/23.

## 2023-04-14 ENCOUNTER — Other Ambulatory Visit (HOSPITAL_COMMUNITY): Payer: Self-pay

## 2023-04-30 ENCOUNTER — Other Ambulatory Visit: Payer: Self-pay

## 2023-04-30 NOTE — Progress Notes (Signed)
Specialty Pharmacy Refill Coordination Note  Dennis Leonard is a 57 y.o. male contacted today regarding refills of specialty medication(s) Interferon Beta-1a   Patient requested [name] at [facility] at [place] date: 05/17/23   Medication will be filled on 05/14/23.

## 2023-05-14 ENCOUNTER — Other Ambulatory Visit: Payer: Self-pay

## 2023-05-15 ENCOUNTER — Other Ambulatory Visit (HOSPITAL_COMMUNITY): Payer: Self-pay

## 2023-05-27 DIAGNOSIS — Z79899 Other long term (current) drug therapy: Secondary | ICD-10-CM | POA: Diagnosis not present

## 2023-05-27 DIAGNOSIS — L400 Psoriasis vulgaris: Secondary | ICD-10-CM | POA: Diagnosis not present

## 2023-05-31 NOTE — Progress Notes (Unsigned)
PATIENT: Dennis Leonard DOB: [birth year]  REASON FOR VISIT: follow up for MS HISTORY FROM: patient  Primary Neurologist: Dr. Marjory Lies since Dr. Anne Hahn retired  HISTORY OF PRESENT ILLNESS: Today 05/31/23 MRI of the brain and cervical spine December 2023 showed few small periventricular and subcortical MS consistent plaques, no new plaques since 2018.  Mild chronic plaques C2 and C3, prior C4 plaque not clearly visualized on study.  No new plaques compared to 2018.  Update 05/26/22 SS: Feels MS doing remarkably well. Remains on the Avonex. At some point he may want to come off Avonex. He really doesn't want to change medications. He does experience flu like symptoms the day after, but manages with Tylenol and Ibuprofen. Rarely his left leg drags. He feels he is the same as he was 15 years ago. MRI imaging ordered last visit, didn't have it done.   Update 06/18/21 SS: Dennis Leonard here today for follow-up with history of RRMS on Avonex.  Has remained stable.  He works as a Education administrator.  He may notice some difficulty with word finding, or occasionally drag his left leg.  Otherwise no issues.  Has been stable for about 15 years.  MRIs were ordered last year, but were not completed.  No new issues. His wife is an NP, she does his injections, has tolerated well.  Here today unaccompanied.  Update 06/18/2020 SS: Dennis Leonard is a 57 year old male with history of relapsing remitting multiple sclerosis.  He remains on Avonex.  He works as a Education administrator.  Last MRI of the brain was in 2018, no change from 2010.  MRI of cervical spine showed mild multilevel degenerative disc changes, no significant stenosis (patchy cord signal at C2, C3, and C4).  For MS, he may occasionally drag his left leg or have word finding difficulties, as been chronic.  Is overall stable.  Wishes he were more active. His wife is an NP, they have 2 kids (10 and 14).  Tolerates Avonex well.  Does wonder about coming off the medication.  Presents today  for evaluation unaccompanied.  HISTORY 06/19/2019 SS: Dennis Leonard is a 57 year old male with history of relapsing remitting multiple sclerosis.  He remains on Avonex.  Last MRI of the brain was in 2018, no change from 2010.  MRI of the cervical spine showed mild multilevel degenerative disc changes, no significant stenosis.  He works as a Education administrator.  He indicates his symptoms have been stable since initial diagnosis.  He may occasionally drag his left leg, or have word finding difficulty, otherwise no overt symptoms.  He has not had any falls. The symptoms are not constant.  He denies any new numbness or weakness to his arms or legs, changes to his vision, or changes to his bowels or bladder.  He indicates his overall health has been well.  He presents today for evaluation unaccompanied.   REVIEW OF SYSTEMS: Out of a complete 14 system review of symptoms, the patient complains only of the following symptoms, and all other reviewed systems are negative.  See HPI  ALLERGIES: No Known Allergies  HOME MEDICATIONS: Outpatient Medications Prior to Visit  Medication Sig Dispense Refill   folic acid (FOLVITE) 1 MG tablet Take 1 tablet by mouth once a day 30 tablet 10   interferon beta-1a (AVONEX) 30 MCG/0.5ML PSKT injection INJECT 0.5 MLS INTO THE MUSCLE EVERY 7 DAYS. 1 each 11   metaxalone (SKELAXIN) 800 MG tablet Take 1/2 tablet twice daily as needed for muscle spasms 30  tablet 1   methotrexate (RHEUMATREX) 2.5 MG tablet Take 6 tablets (15 mg total) by mouth once a week. 24 tablet 10   No facility-administered medications prior to visit.    PAST MEDICAL HISTORY: Past Medical History:  Diagnosis Date   History of chickenpox    Low back pain    Multiple sclerosis, relapsing-remitting (HCC) 07/23/2006 dx    PAST SURGICAL HISTORY: Past Surgical History:  Procedure Laterality Date   APPENDECTOMY  1994   TONSILLECTOMY  1974    FAMILY HISTORY: Family History  Problem Relation Age of Onset    Breast cancer Mother 63   Hyperlipidemia Mother    Alcohol abuse Maternal Uncle    Lung cancer Maternal Aunt 62   Birth defects Sister     SOCIAL HISTORY: Social History   Socioeconomic History   Marital status: Married    Spouse name: Not on file   Number of children: 2   Years of education: COLLEGE   Highest education level: Professional school degree (e.g., MD, DDS, DVM, JD)  Occupational History   Occupation: JUDGE  Tobacco Use   Smoking status: Former    Current packs/day: 0.00    Types: Cigarettes    Quit date: 06/22/2004    Years since quitting: 18.9   Smokeless tobacco: Never   Tobacco comments:    JD, Optometrist. Married, lives with wife and 2 sons; 3 dogs   Vaping Use   Vaping status: Never Used  Substance and Sexual Activity   Alcohol use: No   Drug use: No   Sexual activity: Yes  Other Topics Concern   Not on file  Social History Narrative   Patient drinks 1-2 cups of caffeine daily.   Patient is right handed.   Social Determinants of Health   Financial Resource Strain: Low Risk  (11/07/2022)   Overall Financial Resource Strain (CARDIA)    Difficulty of Paying Living Expenses: Not hard at all  Food Insecurity: No Food Insecurity (11/07/2022)   Hunger Vital Sign    Worried About Running Out of Food in the Last Year: Never true    Ran Out of Food in the Last Year: Never true  Transportation Needs: No Transportation Needs (11/07/2022)   PRAPARE - Administrator, Civil Service (Medical): No    Lack of Transportation (Non-Medical): No  Physical Activity: Insufficiently Active (11/07/2022)   Exercise Vital Sign    Days of Exercise per Week: 4 days    Minutes of Exercise per Session: 20 min  Stress: No Stress Concern Present (11/07/2022)   [employer] of Occupational Health - Occupational Stress Questionnaire    Feeling of Stress : Not at all  Social Connections: Moderately Integrated (11/07/2022)   Social Connection and Isolation  Panel [NHANES]    Frequency of Communication with Friends and Family: Twice a week    Frequency of Social Gatherings with Friends and Family: Twice a week    Attends Religious Services: 1 to 4 times per year    Active Member of [employer] or Organizations: No    Attends Engineer, structural: Not on file    Marital Status: Married  Catering manager Violence: Not on file   PHYSICAL EXAM  There were no vitals filed for this visit.   There is no height or weight on file to calculate BMI.  Generalized: Well developed, in no acute distress  Neurological examination  Mentation: Alert oriented to time, place, history taking. Follows all commands speech  and language fluent Cranial nerve II-XII: Pupils were equal round reactive to light. Extraocular movements were full, visual field were full on confrontational test. Facial sensation and strength were normal.  Head turning and shoulder shrug  were normal and symmetric. Motor: The motor testing reveals 5 over 5 strength of all 4 extremities. Good symmetric motor tone is noted throughout.  Sensory: Sensory testing is intact to soft touch on all 4 extremities. No evidence of extinction is noted.  Coordination: Cerebellar testing reveals good finger-nose-finger and heel-to-shin bilaterally.  Gait and station: Gait is normal. Tandem gait is normal.  Reflexes: Deep tendon reflexes are symmetric but slightly increased throughout.   DIAGNOSTIC DATA (LABS, IMAGING, TESTING) - I reviewed patient records, labs, notes, testing and imaging myself where available.  Lab Results  Component Value Date   WBC 4.8 05/26/2022   HGB 16.3 05/26/2022   HCT 46.9 05/26/2022   MCV 96 05/26/2022   PLT 319 05/26/2022      Component Value Date/Time   NA 144 05/26/2022 1401   K 4.5 05/26/2022 1401   CL 104 05/26/2022 1401   CO2 26 05/26/2022 1401   GLUCOSE 81 05/26/2022 1401   GLUCOSE 94 01/13/2019 0902   BUN 12 05/26/2022 1401   CREATININE 0.99 05/26/2022  1401   CALCIUM 9.5 05/26/2022 1401   PROT 7.0 05/26/2022 1401   ALBUMIN 4.6 05/26/2022 1401   AST 31 05/26/2022 1401   ALT 50 (H) 05/26/2022 1401   ALKPHOS 51 05/26/2022 1401   BILITOT 0.4 05/26/2022 1401   GFRNONAA 80 06/18/2020 1138   GFRAA 93 06/18/2020 1138   Lab Results  Component Value Date   CHOL 161 01/13/2019   HDL 36.00 (L) 01/13/2019   LDLDIRECT 81.0 01/13/2019   TRIG 344.0 (H) 01/13/2019   CHOLHDL 4 01/13/2019   Lab Results  Component Value Date   HGBA1C 5.1 01/13/2019   No results found for: "VITAMINB12" Lab Results  Component Value Date   TSH 1.57 08/25/2011   ASSESSMENT AND PLAN 57 y.o. year old male  has a past medical history of History of chickenpox, Low back pain, and Multiple sclerosis, relapsing-remitting (HCC) (07/23/2006 dx). here with:  1.  Relapsing remitting multiple sclerosis -Continue Avonex, he remains overall stable -Check CBC, CMP -Check MRI of the brain and cervical spine with and without contrast for MS surveillance -Follow-up in 1 year or sooner if needed  Otila Kluver, DNP 05/31/2023, 9:45 PM [facility] [address], [address] [place], [place] [address] [phone]

## 2023-06-01 ENCOUNTER — Ambulatory Visit (INDEPENDENT_AMBULATORY_CARE_PROVIDER_SITE_OTHER): Payer: 59 | Admitting: Neurology

## 2023-06-01 ENCOUNTER — Encounter: Payer: Self-pay | Admitting: Neurology

## 2023-06-01 VITALS — BP 134/82 | Resp 15 | Ht 73.0 in | Wt 236.5 lb

## 2023-06-01 DIAGNOSIS — G35 Multiple sclerosis: Secondary | ICD-10-CM | POA: Diagnosis not present

## 2023-06-01 NOTE — Patient Instructions (Signed)
Continue Avonex, go to primary care doctor to have your labs done I will have you see Dr. Marjory Lies at next visit to establish care

## 2023-06-04 ENCOUNTER — Other Ambulatory Visit: Payer: Self-pay

## 2023-06-14 ENCOUNTER — Other Ambulatory Visit: Payer: Self-pay

## 2023-06-14 NOTE — Progress Notes (Signed)
Specialty Pharmacy Refill Coordination Note  Dennis Leonard is a 57 y.o. male contacted today regarding refills of specialty medication(s) Interferon Beta-1a [name])   Patient requested [name] at [facility] at [place] date: 06/29/23   Medication will be filled on 06/27/22.

## 2023-06-24 ENCOUNTER — Other Ambulatory Visit (HOSPITAL_BASED_OUTPATIENT_CLINIC_OR_DEPARTMENT_OTHER): Payer: Self-pay

## 2023-06-24 MED ORDER — FLULAVAL 0.5 ML IM SUSY
PREFILLED_SYRINGE | INTRAMUSCULAR | 0 refills | Status: AC
  Filled 2023-06-24: qty 0.5, 1d supply, fill #0

## 2023-06-30 ENCOUNTER — Other Ambulatory Visit: Payer: Self-pay

## 2023-06-30 ENCOUNTER — Other Ambulatory Visit: Payer: Self-pay | Admitting: Neurology

## 2023-07-01 ENCOUNTER — Other Ambulatory Visit (HOSPITAL_COMMUNITY): Payer: Self-pay

## 2023-07-01 ENCOUNTER — Other Ambulatory Visit: Payer: Self-pay | Admitting: Pharmacist

## 2023-07-01 ENCOUNTER — Other Ambulatory Visit: Payer: Self-pay

## 2023-07-01 MED ORDER — INTERFERON BETA-1A 30 MCG/0.5ML IM PSKT
PREFILLED_SYRINGE | INTRAMUSCULAR | 11 refills | Status: DC
  Filled 2023-07-01: qty 1, fill #0

## 2023-07-01 MED ORDER — INTERFERON BETA-1A 30 MCG/0.5ML IM PSKT
PREFILLED_SYRINGE | INTRAMUSCULAR | 11 refills | Status: DC
  Filled 2023-07-01: qty 1, 28d supply, fill #0
  Filled 2023-08-02: qty 1, 28d supply, fill #1
  Filled 2023-08-24: qty 1, 28d supply, fill #2
  Filled 2023-10-01: qty 1, 28d supply, fill #3
  Filled 2023-10-28 (×2): qty 1, 28d supply, fill #4
  Filled 2023-12-06 – 2023-12-30 (×3): qty 1, 28d supply, fill #5
  Filled 2024-01-20: qty 1, 28d supply, fill #6
  Filled 2024-02-22: qty 1, 28d supply, fill #7
  Filled 2024-03-28: qty 1, 28d supply, fill #8
  Filled 2024-05-16: qty 1, 28d supply, fill #9
  Filled 2024-06-12: qty 1, 28d supply, fill #10

## 2023-07-02 ENCOUNTER — Other Ambulatory Visit: Payer: Self-pay

## 2023-07-06 ENCOUNTER — Other Ambulatory Visit: Payer: Self-pay

## 2023-07-15 ENCOUNTER — Other Ambulatory Visit: Payer: Self-pay

## 2023-07-22 ENCOUNTER — Other Ambulatory Visit: Payer: Self-pay

## 2023-07-22 ENCOUNTER — Other Ambulatory Visit (HOSPITAL_COMMUNITY): Payer: Self-pay

## 2023-07-28 ENCOUNTER — Other Ambulatory Visit (HOSPITAL_COMMUNITY): Payer: Self-pay

## 2023-07-30 ENCOUNTER — Other Ambulatory Visit (HOSPITAL_COMMUNITY): Payer: Self-pay

## 2023-07-30 ENCOUNTER — Encounter (HOSPITAL_COMMUNITY): Payer: Self-pay

## 2023-07-31 ENCOUNTER — Other Ambulatory Visit (HOSPITAL_BASED_OUTPATIENT_CLINIC_OR_DEPARTMENT_OTHER): Payer: Self-pay

## 2023-08-02 ENCOUNTER — Other Ambulatory Visit: Payer: Self-pay

## 2023-08-02 ENCOUNTER — Other Ambulatory Visit (HOSPITAL_COMMUNITY): Payer: Self-pay

## 2023-08-02 NOTE — Progress Notes (Signed)
 Specialty Pharmacy Refill Coordination Note  Dennis Leonard is a 58 y.o. male contacted today regarding refills of specialty medication(s) Interferon Beta-1a  (AVONEX )   Patient requested [name] at [facility] at [place] date: 08/04/23   Medication will be filled on 08/03/23.

## 2023-08-03 ENCOUNTER — Other Ambulatory Visit: Payer: Self-pay

## 2023-08-05 ENCOUNTER — Other Ambulatory Visit (HOSPITAL_BASED_OUTPATIENT_CLINIC_OR_DEPARTMENT_OTHER): Payer: Self-pay

## 2023-08-24 ENCOUNTER — Other Ambulatory Visit (HOSPITAL_COMMUNITY): Payer: Self-pay

## 2023-08-24 ENCOUNTER — Other Ambulatory Visit: Payer: Self-pay

## 2023-08-24 NOTE — Progress Notes (Signed)
 Specialty Pharmacy Refill Coordination Note  Dennis Leonard is a 59 y.o. male contacted today regarding refills of specialty medication(s) Avonex.  Patient requested (Patient-Rptd) Pickup at [facility] at [facility] date: (Patient-Rptd) 09/07/23   Medication will be filled on 09/06/23.

## 2023-08-31 ENCOUNTER — Telehealth: Payer: Self-pay | Admitting: Pharmacist

## 2023-08-31 NOTE — Telephone Encounter (Signed)
 Pt returned call and I tried to connect him but got voicemail. Please return call.

## 2023-08-31 NOTE — Telephone Encounter (Signed)
 Called patient to schedule an appointment for the [facility] [employer] Specialty Medication [facility]. I was unable to reach the patient so I left a HIPAA-compliant message requesting that the patient return my call.   Butch Penny, PharmD, Patsy [place], CPP Clinical Pharmacist [facility] [phone]

## 2023-09-06 ENCOUNTER — Other Ambulatory Visit: Payer: Self-pay

## 2023-09-06 ENCOUNTER — Other Ambulatory Visit (HOSPITAL_COMMUNITY): Payer: Self-pay

## 2023-09-06 NOTE — Progress Notes (Signed)
 Pharmacy Patient Advocate Encounter   Received notification from Patient Pharmacy that prior authorization for Avonex is required/requested.   Insurance verification completed.   The patient is insured through [facility] .   Per test claim: PA required; PA submitted to above mentioned insurance via CoverMyMeds Key/confirmation #/EOC [id number] Status is pending

## 2023-09-06 NOTE — Progress Notes (Signed)
 Pharmacy Patient Advocate Encounter  Received notification from [facility] that Prior Authorization for Avonex has been APPROVED from 09/06/23 to 09/05/24   PA #/Case ID/Reference #: [id number]

## 2023-09-28 ENCOUNTER — Other Ambulatory Visit: Payer: Self-pay

## 2023-09-30 ENCOUNTER — Other Ambulatory Visit: Payer: Self-pay

## 2023-10-01 ENCOUNTER — Other Ambulatory Visit (HOSPITAL_COMMUNITY): Payer: Self-pay

## 2023-10-01 ENCOUNTER — Other Ambulatory Visit: Payer: Self-pay | Admitting: Pharmacy Technician

## 2023-10-01 ENCOUNTER — Other Ambulatory Visit: Payer: Self-pay

## 2023-10-01 DIAGNOSIS — L400 Psoriasis vulgaris: Secondary | ICD-10-CM | POA: Diagnosis not present

## 2023-10-01 DIAGNOSIS — Z79899 Other long term (current) drug therapy: Secondary | ICD-10-CM | POA: Diagnosis not present

## 2023-10-01 NOTE — Progress Notes (Signed)
 Specialty Pharmacy Refill Coordination Note  Dennis Leonard is a 58 y.o. male contacted today regarding refills of specialty medication(s) Interferon Beta-1a Dennis Leonard)   Patient requested Dennis Leonard at [facility] at [place] date: 10/08/23   Medication will be filled on 10/08/23.

## 2023-10-02 ENCOUNTER — Other Ambulatory Visit (HOSPITAL_COMMUNITY): Payer: Self-pay

## 2023-10-07 ENCOUNTER — Other Ambulatory Visit: Payer: Self-pay

## 2023-10-27 ENCOUNTER — Other Ambulatory Visit: Payer: Self-pay

## 2023-10-28 ENCOUNTER — Other Ambulatory Visit (HOSPITAL_COMMUNITY): Payer: Self-pay

## 2023-10-28 ENCOUNTER — Other Ambulatory Visit: Payer: Self-pay

## 2023-10-28 NOTE — Progress Notes (Signed)
 Specialty Pharmacy Refill Coordination Note  Dennis Leonard is a 58 y.o. male contacted today regarding refills of specialty medication(s) Avonex .  Patient requested (Patient-Rptd) Pickup at [facility] at [facility] date: (Patient-Rptd) 11/04/23   Medication will be filled on 11/03/23.

## 2023-11-03 ENCOUNTER — Other Ambulatory Visit (HOSPITAL_BASED_OUTPATIENT_CLINIC_OR_DEPARTMENT_OTHER): Payer: Self-pay

## 2023-11-04 ENCOUNTER — Other Ambulatory Visit (HOSPITAL_BASED_OUTPATIENT_CLINIC_OR_DEPARTMENT_OTHER): Payer: Self-pay

## 2023-11-04 MED ORDER — METHOTREXATE SODIUM 2.5 MG PO TABS
15.0000 mg | ORAL_TABLET | ORAL | 10 refills | Status: AC
  Filled 2023-11-04: qty 24, 28d supply, fill #0
  Filled 2023-12-04 (×2): qty 24, 28d supply, fill #1
  Filled 2024-01-03: qty 24, 28d supply, fill #2
  Filled 2024-01-30: qty 24, 28d supply, fill #3
  Filled 2024-02-28: qty 24, 28d supply, fill #4
  Filled 2024-04-06: qty 24, 28d supply, fill #5
  Filled 2024-05-01: qty 24, 28d supply, fill #6
  Filled 2024-05-29: qty 24, 28d supply, fill #7

## 2023-11-06 ENCOUNTER — Other Ambulatory Visit (HOSPITAL_COMMUNITY): Payer: Self-pay

## 2023-11-12 ENCOUNTER — Encounter: Payer: 59 | Admitting: Family Medicine

## 2023-11-25 ENCOUNTER — Other Ambulatory Visit: Payer: Self-pay

## 2023-11-25 NOTE — Progress Notes (Signed)
 Specialty Pharmacy Ongoing Clinical Assessment Note  Dennis Leonard is a 58 y.o. male who is being followed by the specialty pharmacy service for RxSp Multiple Sclerosis   Patient's specialty medication(s) reviewed today: Interferon Beta-1a  (AVONEX )   Missed doses in the last 4 weeks: 0   Patient/Caregiver did not have any additional questions or concerns.   Therapeutic benefit summary: Patient is achieving benefit   Adverse events/side effects summary: No adverse events/side effects   Patient's therapy is appropriate to: Continue    Goals Addressed             This Visit's Progress    Reduce long-term complications   On track    Patient is on track. Patient will maintain adherence and adhere to provider and/or lab appointments. Patient states he continues to feel very well on Avonex  as he always has since initiating treatment.          Follow up: 12 months  [facility]

## 2023-11-26 ENCOUNTER — Other Ambulatory Visit: Payer: 59

## 2023-12-04 ENCOUNTER — Other Ambulatory Visit (HOSPITAL_BASED_OUTPATIENT_CLINIC_OR_DEPARTMENT_OTHER): Payer: Self-pay

## 2023-12-06 ENCOUNTER — Encounter (INDEPENDENT_AMBULATORY_CARE_PROVIDER_SITE_OTHER): Payer: Self-pay

## 2023-12-06 ENCOUNTER — Other Ambulatory Visit: Payer: Self-pay

## 2023-12-06 NOTE — Progress Notes (Signed)
 Specialty Pharmacy Refill Coordination Note  Dennis Leonard is a 58 y.o. male contacted today regarding refills of specialty medication(s) Interferon Beta-1a  (AVONEX )   Patient requested [name] at [facility] at [place] date: 12/22/23   Medication will be filled on 12/21/23.

## 2023-12-07 ENCOUNTER — Other Ambulatory Visit: Payer: Self-pay

## 2023-12-16 ENCOUNTER — Other Ambulatory Visit (HOSPITAL_BASED_OUTPATIENT_CLINIC_OR_DEPARTMENT_OTHER): Payer: Self-pay

## 2023-12-17 ENCOUNTER — Other Ambulatory Visit (HOSPITAL_BASED_OUTPATIENT_CLINIC_OR_DEPARTMENT_OTHER): Payer: Self-pay

## 2023-12-17 MED ORDER — FOLIC ACID 1 MG PO TABS
1.0000 mg | ORAL_TABLET | Freq: Every day | ORAL | 10 refills | Status: AC
  Filled 2023-12-17: qty 30, 30d supply, fill #0
  Filled 2024-01-29: qty 30, 30d supply, fill #1
  Filled 2024-03-23: qty 30, 30d supply, fill #2
  Filled 2024-05-15: qty 30, 30d supply, fill #3

## 2023-12-21 ENCOUNTER — Other Ambulatory Visit: Payer: Self-pay

## 2023-12-21 NOTE — Progress Notes (Signed)
 Patient has credit card for Avonex . We currently do not have credit card on file. Copay card company will not give me information. He will need to call [phone] if he does not have information. Attemped to contact pt-no ans; no vm.

## 2023-12-21 NOTE — Progress Notes (Signed)
 Calling copay card now to see what is causing high copay

## 2023-12-23 ENCOUNTER — Other Ambulatory Visit: Payer: Self-pay

## 2023-12-23 ENCOUNTER — Other Ambulatory Visit (HOSPITAL_COMMUNITY): Payer: Self-pay

## 2023-12-23 NOTE — Progress Notes (Signed)
 Spoke with patient. He is calling [employer]  for credit card. He will call pharmacy back once he has information.

## 2023-12-30 ENCOUNTER — Other Ambulatory Visit: Payer: Self-pay

## 2023-12-30 ENCOUNTER — Other Ambulatory Visit (HOSPITAL_COMMUNITY): Payer: Self-pay

## 2023-12-30 NOTE — Progress Notes (Signed)
 Patient called back and is on the way to [facility] to pick up. Mediation was reprocessed for pick up and patient was reminded to bring in the manufacturer credit card for copayment.

## 2023-12-30 NOTE — Progress Notes (Signed)
 Message Received from [facility] for Return to [place]. Called & LVM for Patient need to call [phone] for CC info for Avonex . Medication was filled since 12/21/2023.

## 2023-12-31 ENCOUNTER — Other Ambulatory Visit: Payer: Self-pay

## 2023-12-31 ENCOUNTER — Other Ambulatory Visit (HOSPITAL_COMMUNITY): Payer: Self-pay

## 2024-01-20 ENCOUNTER — Other Ambulatory Visit: Payer: Self-pay | Admitting: Pharmacy Technician

## 2024-01-20 ENCOUNTER — Other Ambulatory Visit: Payer: Self-pay

## 2024-01-20 NOTE — Progress Notes (Signed)
 Specialty Pharmacy Refill Coordination Note  Dennis Leonard is a 58 y.o. male contacted today regarding refills of specialty medication(s) Interferon Beta-1a  (AVONEX )   Patient requested [name] at [facility] at [place] date: 01/29/24   Medication will be filled on 01/28/24.

## 2024-01-28 ENCOUNTER — Other Ambulatory Visit: Payer: Self-pay

## 2024-02-04 DIAGNOSIS — L400 Psoriasis vulgaris: Secondary | ICD-10-CM | POA: Diagnosis not present

## 2024-02-07 ENCOUNTER — Encounter: Admitting: Family Medicine

## 2024-02-18 ENCOUNTER — Other Ambulatory Visit: Payer: Self-pay

## 2024-02-22 ENCOUNTER — Other Ambulatory Visit: Payer: Self-pay

## 2024-02-24 ENCOUNTER — Other Ambulatory Visit: Payer: Self-pay

## 2024-02-24 NOTE — Progress Notes (Signed)
 Specialty Pharmacy Refill Coordination Note  Dennis Leonard is a 58 y.o. male contacted today regarding refills of specialty medication(s) Interferon Beta-1a  (AVONEX )   Patient requested [name] at [facility] at [place] date: 03/02/24   Medication will be filled on 03/01/24.

## 2024-03-01 ENCOUNTER — Other Ambulatory Visit: Payer: Self-pay

## 2024-03-05 ENCOUNTER — Other Ambulatory Visit (HOSPITAL_COMMUNITY): Payer: Self-pay

## 2024-03-06 ENCOUNTER — Encounter: Admitting: Family Medicine

## 2024-03-28 ENCOUNTER — Other Ambulatory Visit: Payer: Self-pay

## 2024-03-28 NOTE — Progress Notes (Signed)
 Specialty Pharmacy Refill Coordination Note  Dennis Leonard is a 58 y.o. male contacted today regarding refills of specialty medication(s) Interferon Beta-1a  (AVONEX )   Patient requested [name] at [facility] at [place] date: 04/11/24   Medication will be filled on 04/10/24.

## 2024-04-10 ENCOUNTER — Other Ambulatory Visit: Payer: Self-pay

## 2024-04-11 ENCOUNTER — Other Ambulatory Visit: Payer: Self-pay

## 2024-04-18 ENCOUNTER — Other Ambulatory Visit: Payer: Self-pay

## 2024-04-20 ENCOUNTER — Other Ambulatory Visit (HOSPITAL_COMMUNITY): Payer: Self-pay

## 2024-04-21 ENCOUNTER — Other Ambulatory Visit (HOSPITAL_COMMUNITY): Payer: Self-pay

## 2024-04-24 ENCOUNTER — Other Ambulatory Visit (HOSPITAL_COMMUNITY): Payer: Self-pay

## 2024-04-24 ENCOUNTER — Other Ambulatory Visit: Payer: Self-pay

## 2024-05-16 ENCOUNTER — Other Ambulatory Visit (HOSPITAL_COMMUNITY): Payer: Self-pay

## 2024-05-19 ENCOUNTER — Other Ambulatory Visit: Payer: Self-pay

## 2024-05-19 NOTE — Progress Notes (Signed)
 Specialty Pharmacy Refill Coordination Note  Dennis Leonard is a 58 y.o. male contacted today regarding refills of specialty medication(s) Interferon Beta-1a  (AVONEX )   Patient requested [name] at [facility] at [place] date: 05/23/24   Medication will be filled on: 05/22/24

## 2024-05-22 ENCOUNTER — Other Ambulatory Visit: Payer: Self-pay

## 2024-06-05 ENCOUNTER — Encounter: Payer: Self-pay | Admitting: Diagnostic Neuroimaging

## 2024-06-05 ENCOUNTER — Ambulatory Visit: Payer: 59 | Admitting: Diagnostic Neuroimaging

## 2024-06-05 VITALS — BP 142/87 | HR 87 | Ht 72.0 in | Wt 238.6 lb

## 2024-06-05 DIAGNOSIS — G35A Relapsing-remitting multiple sclerosis: Secondary | ICD-10-CM

## 2024-06-05 NOTE — Progress Notes (Signed)
 [facility]  PATIENT: Dennis Leonard DOB: 1965/10/31  REFERRING CLINICIAN: Billy Philippe SAUNDERS, NP HISTORY FROM: patient  REASON FOR VISIT: follow up   HISTORICAL  CHIEF COMPLAINT:  Chief Complaint  Patient presents with   RM 7     Patient is here for MS - no concerns would like to come off his medications     HISTORY OF PRESENT ILLNESS:   58 year old male here for evaluation follow-up of multiple sclerosis.  Patient had symptoms in January 2008 left double vision and left leg difficulty.  Initially went to ophthalmology clinic, had MRI of the orbits, was found to have some abnormal brain lesions.  Was referred to neurology and found to have additional brain and spinal cord lesions.  He was diagnosed with multiple sclerosis after lab testing and spinal tap testing.  He was treated with IV steroids and started on Avonex .  He has done extremely well over many years.  No relapses since that time.  MRI scans have been stable since that time.   REVIEW OF SYSTEMS: Full 14 system review of systems performed and negative with exception of: as per HPI.  ALLERGIES: Allergies[1]  HOME MEDICATIONS: Outpatient Medications Prior to Visit  Medication Sig Dispense Refill   folic acid  (FOLVITE ) 1 MG tablet Take 1 tablet by mouth once a day 30 tablet 10   influenza vac split trivalent PF (FLULAVAL ) 0.5 ML injection Inject into the muscle. 0.5 mL 0   interferon beta-1a  (AVONEX ) 30 MCG/0.5ML PSKT injection INJECT 0.5 MLS INTO THE MUSCLE EVERY 7 DAYS. 1 each 11   metaxalone  (SKELAXIN ) 800 MG tablet Take 1/2 tablet twice daily as needed for muscle spasms 30 tablet 1   methotrexate  (RHEUMATREX) 2.5 MG tablet Take 6 tablets (15 mg total) by mouth once a week. 24 tablet 10   No facility-administered medications prior to visit.    PAST MEDICAL HISTORY: Past Medical History:  Diagnosis Date   History of chickenpox    Low back pain    Multiple sclerosis, relapsing-remitting  07/23/2006 dx    PAST SURGICAL HISTORY: Past Surgical History:  Procedure Laterality Date   APPENDECTOMY  1994   TONSILLECTOMY  1974    FAMILY HISTORY: Family History  Problem Relation Age of Onset   Breast cancer Mother 18   Hyperlipidemia Mother    Birth defects Sister    Lung cancer Maternal Aunt 57   Alcohol abuse Maternal Uncle    Migraines Neg Hx    Seizures Neg Hx    Stroke Neg Hx    Sleep apnea Neg Hx     SOCIAL HISTORY: Social History   Socioeconomic History   Marital status: Married    Spouse name: Not on file   Number of children: 2   Years of education: COLLEGE   Highest education level: Professional school degree (e.g., MD, DDS, DVM, JD)  Occupational History   Occupation: JUDGE  Tobacco Use   Smoking status: Former    Current packs/day: 0.00    Types: Cigarettes    Quit date: 06/22/2004    Years since quitting: 19.9   Smokeless tobacco: Never   Tobacco comments:    JD, optometrist. Married, lives with wife and 2 sons; 3 dogs   Vaping Use   Vaping status: Never Used  Substance and Sexual Activity   Alcohol use: No   Drug use: No   Sexual activity: Yes  Other Topics Concern   Not on file  Social History  Narrative   Patient drinks 1-2 cups of caffeine daily.   Patient is right handed.   Social Drivers of Health   Tobacco Use: Medium Risk (06/05/2024)   Patient History    Smoking Tobacco Use: Former    Smokeless Tobacco Use: Never    Passive Exposure: Not on file  Financial Resource Strain: Low Risk (11/07/2022)   Overall Financial Resource Strain (CARDIA)    Difficulty of Paying Living Expenses: Not hard at all  Food Insecurity: No Food Insecurity (11/07/2022)   Hunger Vital Sign    Worried About Running Out of Food in the Last Year: Never true    Ran Out of Food in the Last Year: Never true  Transportation Needs: No Transportation Needs (11/07/2022)   PRAPARE - Administrator, Civil Service (Medical): No    Lack of  Transportation (Non-Medical): No  Physical Activity: Insufficiently Active (11/07/2022)   Exercise Vital Sign    Days of Exercise per Week: 4 days    Minutes of Exercise per Session: 20 min  Stress: No Stress Concern Present (11/07/2022)   [employer] of Occupational Health - Occupational Stress Questionnaire    Feeling of Stress : Not at all  Social Connections: Moderately Integrated (11/07/2022)   Social Connection and Isolation Panel    Frequency of Communication with Friends and Family: Twice a week    Frequency of Social Gatherings with Friends and Family: Twice a week    Attends Religious Services: 1 to 4 times per year    Active Member of [employer] or Organizations: No    Attends Engineer, Structural: Not on file    Marital Status: Married  Catering Manager Violence: Not on file  Depression (PHQ2-9): Low Risk (11/11/2022)   Depression (PHQ2-9)    PHQ-2 Score: 1  Alcohol Screen: Not on file  Housing: Low Risk (11/07/2022)   Housing    Last Housing Risk Score: 0  Utilities: Not on file  Health Literacy: Not on file     PHYSICAL EXAM  GENERAL EXAM/CONSTITUTIONAL: Vitals:  Vitals:   06/05/24 1514  BP: (!) 142/87  Pulse: 87  SpO2: 96%  Weight: 238 lb 9.6 oz (108.2 kg)  Height: 6' (1.829 m)   Body mass index is 32.36 kg/m. Wt Readings from Last 3 Encounters:  06/05/24 238 lb 9.6 oz (108.2 kg)  06/01/23 236 lb 8 oz (107.3 kg)  11/11/22 228 lb 2 oz (103.5 kg)   Patient is in no distress; well developed, nourished and groomed; neck is supple  CARDIOVASCULAR: Examination of carotid arteries is normal; no carotid bruits Regular rate and rhythm, no murmurs Examination of peripheral vascular system by observation and palpation is normal  EYES: Ophthalmoscopic exam of optic discs and posterior segments is normal; no papilledema or hemorrhages No results found.  MUSCULOSKELETAL: Gait, strength, tone, movements noted in Neurologic exam  below  NEUROLOGIC: MENTAL STATUS:      No data to display         awake, alert, oriented to person, place and time recent and remote memory intact normal attention and concentration language fluent, comprehension intact, naming intact fund of knowledge appropriate  CRANIAL NERVE:  2nd - no papilledema on fundoscopic exam 2nd, 3rd, 4th, 6th - pupils equal and reactive to light, visual fields full to confrontation, extraocular muscles intact, no nystagmus 5th - facial sensation symmetric 7th - facial strength symmetric 8th - hearing intact 9th - palate elevates symmetrically, uvula midline 11th - shoulder shrug  symmetric 12th - tongue protrusion midline  MOTOR:  normal bulk and tone, full strength in the BUE, BLE  SENSORY:  normal and symmetric to light touch, temperature, vibration  COORDINATION:  finger-nose-finger, fine finger movements normal  REFLEXES:  deep tendon reflexes 1+ and symmetric  GAIT/STATION:  narrow based gait     DIAGNOSTIC DATA (LABS, IMAGING, TESTING) - I reviewed patient records, labs, notes, testing and imaging myself where available.  Lab Results  Component Value Date   WBC 4.8 05/26/2022   HGB 16.3 05/26/2022   HCT 46.9 05/26/2022   MCV 96 05/26/2022   PLT 319 05/26/2022      Component Value Date/Time   NA 144 05/26/2022 1401   K 4.5 05/26/2022 1401   CL 104 05/26/2022 1401   CO2 26 05/26/2022 1401   GLUCOSE 81 05/26/2022 1401   GLUCOSE 94 01/13/2019 0902   BUN 12 05/26/2022 1401   CREATININE 0.99 05/26/2022 1401   CALCIUM 9.5 05/26/2022 1401   PROT 7.0 05/26/2022 1401   ALBUMIN 4.6 05/26/2022 1401   AST 31 05/26/2022 1401   ALT 50 (H) 05/26/2022 1401   ALKPHOS 51 05/26/2022 1401   BILITOT 0.4 05/26/2022 1401   GFRNONAA 80 06/18/2020 1138   GFRAA 93 06/18/2020 1138   Lab Results  Component Value Date   CHOL 161 01/13/2019   HDL 36.00 (L) 01/13/2019   LDLDIRECT 81.0 01/13/2019   TRIG 344.0 (H) 01/13/2019   CHOLHDL  4 01/13/2019   Lab Results  Component Value Date   HGBA1C 5.1 01/13/2019   No results found for: VITAMINB12 Lab Results  Component Value Date   TSH 1.57 08/25/2011    06/09/22 MRI brain with without contrast demonstrating: -Few small periventricular and subcortical foci of nonspecific T2 hyperintensities, can be consistent with chronic demyelinating plaques. -No acute plaques.  No new plaques compared to 2018.  06/09/22 MRI cervical spine (with and without) demonstrating: -Mild chronic demyelinating plaques at C2 and C3. Prior plaque at C4 level is not clearly visualized on current study. -No acute plaques.  No new plaques compared to 09/07/2016.      ASSESSMENT AND PLAN  58 y.o. year old male here with multiple sclerosis since 2008.   Dx:  1. Multiple sclerosis, relapsing-remitting      PLAN:  RRMS (stable since 2008 diagnosis; symptoms and MRIs have been stable more than 10 years; he would be a good candidate to wean off of disease-modifying therapy in the next 5 to 10 years) - continue avonex  for now; reconsider around age 56-25 years old; then likely could come off therapy all together  Return in about 1 year (around 06/05/2025) for with NP Teddi Born).    EDUARD FABIENE HANLON, MD 06/05/2024, 3:48 PM Certified in Neurology, Neurophysiology and Neuroimaging  [facility] [address] [place], [place] [address] [phone]     [1] No Known Allergies

## 2024-06-06 DIAGNOSIS — Z79899 Other long term (current) drug therapy: Secondary | ICD-10-CM | POA: Diagnosis not present

## 2024-06-06 DIAGNOSIS — L400 Psoriasis vulgaris: Secondary | ICD-10-CM | POA: Diagnosis not present

## 2024-06-12 ENCOUNTER — Other Ambulatory Visit (HOSPITAL_COMMUNITY): Payer: Self-pay

## 2024-06-12 ENCOUNTER — Other Ambulatory Visit: Payer: Self-pay

## 2024-06-13 ENCOUNTER — Other Ambulatory Visit (HOSPITAL_COMMUNITY): Payer: Self-pay

## 2024-06-14 ENCOUNTER — Other Ambulatory Visit: Payer: Self-pay

## 2024-06-14 ENCOUNTER — Other Ambulatory Visit (HOSPITAL_COMMUNITY): Payer: Self-pay

## 2024-06-14 NOTE — Progress Notes (Signed)
 Specialty Pharmacy Refill Coordination Note  Dennis Leonard is a 58 y.o. male contacted today regarding refills of specialty medication(s) Interferon Beta-1a  (AVONEX )   Patient requested [name] at [facility] [place] date: 06/16/24   Medication will be filled on: 06/14/24

## 2024-06-16 ENCOUNTER — Other Ambulatory Visit: Payer: Self-pay

## 2024-06-28 ENCOUNTER — Encounter: Payer: Self-pay | Admitting: Diagnostic Neuroimaging

## 2024-06-28 ENCOUNTER — Other Ambulatory Visit: Payer: Self-pay | Admitting: Diagnostic Neuroimaging

## 2024-06-28 NOTE — Telephone Encounter (Signed)
 [facility] called stated that Pt insurance  recommend [facility] . [facility]  to [facility] Rx interferon beta-1a  (AVONEX ) 30 MCG/0.5ML PSKT injection to be sent to  Pharmacy   Phone : [phone] Fax: [phone]

## 2024-06-28 NOTE — Telephone Encounter (Signed)
 ERROR

## 2024-06-29 MED ORDER — INTERFERON BETA-1A 30 MCG/0.5ML IM PSKT: 30.0000 ug | PREFILLED_SYRINGE | INTRAMUSCULAR | 12 refills | Status: AC

## 2024-06-29 NOTE — Telephone Encounter (Signed)
 I called the patient and left a message for him to call the office back. I wanted to verify that he wants his injection sent to [facility]. Medication is pending and waiting for confirmation.

## 2024-07-07 ENCOUNTER — Other Ambulatory Visit (HOSPITAL_COMMUNITY): Payer: Self-pay

## 2024-07-13 NOTE — Telephone Encounter (Signed)
 Nia from [facility](ph#[phone]) states for the interferon beta-1a  (AVONEX ) 30 MCG/0.5ML PSKT injection  a PA is needed she provided a Key for Cover My Meds#[id number]

## 2024-07-14 ENCOUNTER — Telehealth: Payer: Self-pay | Admitting: Pharmacy Technician

## 2024-07-14 ENCOUNTER — Other Ambulatory Visit (HOSPITAL_COMMUNITY): Payer: Self-pay

## 2024-07-14 NOTE — Telephone Encounter (Signed)
 Pharmacy Patient Advocate Encounter   Received notification from RX Request Messages that prior authorization for AVONEX  30MG  is required/requested.   Insurance verification completed.   The patient is insured through [facility] [employer] [facility].   Per test claim: PA required; PA submitted to above mentioned insurance via Latent Key/confirmation #/EOC [facility] Status is pending

## 2024-07-14 NOTE — Telephone Encounter (Signed)
 PA has been submitted, and telephone encounter has been created. Please see telephone encounter dated 1.23.26.

## 2024-07-18 ENCOUNTER — Other Ambulatory Visit: Payer: Self-pay

## 2024-07-24 ENCOUNTER — Other Ambulatory Visit (HOSPITAL_COMMUNITY): Payer: Self-pay

## 2024-07-24 NOTE — Telephone Encounter (Signed)
 Pharmacy Patient Advocate Encounter  Received notification from [facility] that Prior Authorization for AVONEX  has been APPROVED from 1.23.26 to 1.23.27   PA #/Case ID/Reference #: [id number]  MUST FILL AT SPECIALTY PHARMACY SPECIALTY DRUG: [facility] CALL [phone]

## 2024-07-25 ENCOUNTER — Other Ambulatory Visit: Payer: Self-pay

## 2025-06-05 ENCOUNTER — Ambulatory Visit: Admitting: Neurology
